# Patient Record
Sex: Male | Born: 1955 | Race: White | Hispanic: No | Marital: Married | Smoking: Never smoker
Health system: Southern US, Community
[De-identification: ages and names within clinical notes are randomized; demographics above are authoritative.]

## PROBLEM LIST (undated history)

## (undated) DIAGNOSIS — I10 Essential (primary) hypertension: Secondary | ICD-10-CM

## (undated) DIAGNOSIS — C801 Malignant (primary) neoplasm, unspecified: Secondary | ICD-10-CM

## (undated) DIAGNOSIS — I639 Cerebral infarction, unspecified: Secondary | ICD-10-CM

## (undated) DIAGNOSIS — E7800 Pure hypercholesterolemia, unspecified: Secondary | ICD-10-CM

## (undated) DIAGNOSIS — R569 Unspecified convulsions: Secondary | ICD-10-CM

## (undated) HISTORY — PX: APPENDECTOMY: SHX54

## (undated) HISTORY — DX: Pure hypercholesterolemia, unspecified: E78.00

## (undated) HISTORY — DX: Unspecified convulsions: R56.9

## (undated) HISTORY — DX: Cerebral infarction, unspecified: I63.9

## (undated) HISTORY — DX: Malignant (primary) neoplasm, unspecified: C80.1

## (undated) HISTORY — DX: Essential (primary) hypertension: I10

---

## 2011-07-16 ENCOUNTER — Ambulatory Visit: Payer: BC Managed Care – PPO | Attending: Internal Medicine | Admitting: *Deleted

## 2011-07-16 DIAGNOSIS — I69998 Other sequelae following unspecified cerebrovascular disease: Secondary | ICD-10-CM | POA: Insufficient documentation

## 2011-07-16 DIAGNOSIS — R279 Unspecified lack of coordination: Secondary | ICD-10-CM | POA: Insufficient documentation

## 2011-07-16 DIAGNOSIS — Z5189 Encounter for other specified aftercare: Secondary | ICD-10-CM | POA: Insufficient documentation

## 2011-07-16 DIAGNOSIS — I69920 Aphasia following unspecified cerebrovascular disease: Secondary | ICD-10-CM | POA: Insufficient documentation

## 2011-07-16 DIAGNOSIS — R482 Apraxia: Secondary | ICD-10-CM | POA: Insufficient documentation

## 2011-07-16 DIAGNOSIS — M6281 Muscle weakness (generalized): Secondary | ICD-10-CM | POA: Insufficient documentation

## 2011-07-16 DIAGNOSIS — M2420 Disorder of ligament, unspecified site: Secondary | ICD-10-CM | POA: Insufficient documentation

## 2011-07-16 DIAGNOSIS — M629 Disorder of muscle, unspecified: Secondary | ICD-10-CM | POA: Insufficient documentation

## 2011-07-16 DIAGNOSIS — M25519 Pain in unspecified shoulder: Secondary | ICD-10-CM | POA: Insufficient documentation

## 2011-07-16 DIAGNOSIS — R269 Unspecified abnormalities of gait and mobility: Secondary | ICD-10-CM | POA: Insufficient documentation

## 2011-07-18 ENCOUNTER — Ambulatory Visit: Payer: BC Managed Care – PPO | Admitting: *Deleted

## 2011-07-24 ENCOUNTER — Ambulatory Visit: Payer: BC Managed Care – PPO | Admitting: *Deleted

## 2011-07-24 ENCOUNTER — Ambulatory Visit: Payer: BC Managed Care – PPO | Admitting: Physical Therapy

## 2011-07-31 ENCOUNTER — Ambulatory Visit: Payer: BC Managed Care – PPO

## 2011-07-31 ENCOUNTER — Ambulatory Visit: Payer: BC Managed Care – PPO | Admitting: Physical Therapy

## 2011-08-03 ENCOUNTER — Encounter: Payer: Self-pay | Admitting: Occupational Therapy

## 2011-08-03 ENCOUNTER — Ambulatory Visit: Payer: Self-pay | Admitting: Physical Therapy

## 2011-08-07 ENCOUNTER — Ambulatory Visit: Payer: Self-pay | Admitting: Physical Therapy

## 2011-08-07 ENCOUNTER — Ambulatory Visit: Payer: BC Managed Care – PPO | Admitting: Occupational Therapy

## 2011-08-07 ENCOUNTER — Ambulatory Visit: Payer: BC Managed Care – PPO | Admitting: Rehabilitative and Restorative Service Providers"

## 2011-08-10 ENCOUNTER — Ambulatory Visit: Payer: BC Managed Care – PPO | Attending: Internal Medicine | Admitting: Occupational Therapy

## 2011-08-10 DIAGNOSIS — R482 Apraxia: Secondary | ICD-10-CM | POA: Insufficient documentation

## 2011-08-10 DIAGNOSIS — I69998 Other sequelae following unspecified cerebrovascular disease: Secondary | ICD-10-CM | POA: Insufficient documentation

## 2011-08-10 DIAGNOSIS — M25519 Pain in unspecified shoulder: Secondary | ICD-10-CM | POA: Insufficient documentation

## 2011-08-10 DIAGNOSIS — R269 Unspecified abnormalities of gait and mobility: Secondary | ICD-10-CM | POA: Insufficient documentation

## 2011-08-10 DIAGNOSIS — I69920 Aphasia following unspecified cerebrovascular disease: Secondary | ICD-10-CM | POA: Insufficient documentation

## 2011-08-10 DIAGNOSIS — R279 Unspecified lack of coordination: Secondary | ICD-10-CM | POA: Insufficient documentation

## 2011-08-10 DIAGNOSIS — M2420 Disorder of ligament, unspecified site: Secondary | ICD-10-CM | POA: Insufficient documentation

## 2011-08-10 DIAGNOSIS — M629 Disorder of muscle, unspecified: Secondary | ICD-10-CM | POA: Insufficient documentation

## 2011-08-10 DIAGNOSIS — M6281 Muscle weakness (generalized): Secondary | ICD-10-CM | POA: Insufficient documentation

## 2011-08-10 DIAGNOSIS — Z5189 Encounter for other specified aftercare: Secondary | ICD-10-CM | POA: Insufficient documentation

## 2011-08-13 ENCOUNTER — Ambulatory Visit: Payer: BC Managed Care – PPO | Admitting: Physical Therapy

## 2011-08-13 ENCOUNTER — Ambulatory Visit: Payer: BC Managed Care – PPO | Admitting: Occupational Therapy

## 2011-08-13 ENCOUNTER — Ambulatory Visit: Payer: BC Managed Care – PPO | Admitting: Speech Pathology

## 2011-08-14 ENCOUNTER — Ambulatory Visit: Payer: BC Managed Care – PPO

## 2011-08-14 ENCOUNTER — Ambulatory Visit: Payer: BC Managed Care – PPO | Admitting: Occupational Therapy

## 2011-08-14 ENCOUNTER — Ambulatory Visit: Payer: BC Managed Care – PPO | Admitting: Physical Therapy

## 2011-08-15 ENCOUNTER — Encounter: Payer: Self-pay | Admitting: Physical Medicine & Rehabilitation

## 2011-08-21 ENCOUNTER — Ambulatory Visit: Payer: BC Managed Care – PPO | Admitting: Occupational Therapy

## 2011-08-21 ENCOUNTER — Ambulatory Visit: Payer: BC Managed Care – PPO | Admitting: Physical Therapy

## 2011-08-21 ENCOUNTER — Ambulatory Visit: Payer: BC Managed Care – PPO

## 2011-08-23 ENCOUNTER — Ambulatory Visit: Payer: BC Managed Care – PPO

## 2011-08-23 ENCOUNTER — Ambulatory Visit: Payer: BC Managed Care – PPO | Admitting: Physical Therapy

## 2011-08-23 ENCOUNTER — Ambulatory Visit: Payer: BC Managed Care – PPO | Admitting: Occupational Therapy

## 2011-08-28 ENCOUNTER — Ambulatory Visit: Payer: BC Managed Care – PPO | Admitting: Physical Therapy

## 2011-08-28 ENCOUNTER — Encounter: Payer: BC Managed Care – PPO | Admitting: Occupational Therapy

## 2011-08-30 ENCOUNTER — Ambulatory Visit: Payer: BC Managed Care – PPO | Admitting: Physical Therapy

## 2011-08-30 ENCOUNTER — Ambulatory Visit: Payer: BC Managed Care – PPO

## 2011-08-30 ENCOUNTER — Ambulatory Visit: Payer: BC Managed Care – PPO | Admitting: Occupational Therapy

## 2011-09-04 ENCOUNTER — Ambulatory Visit: Payer: BC Managed Care – PPO | Admitting: Occupational Therapy

## 2011-09-04 ENCOUNTER — Ambulatory Visit: Payer: BC Managed Care – PPO | Admitting: Physical Therapy

## 2011-09-06 ENCOUNTER — Ambulatory Visit: Payer: BC Managed Care – PPO | Admitting: Physical Therapy

## 2011-09-06 ENCOUNTER — Ambulatory Visit: Payer: BC Managed Care – PPO | Admitting: Occupational Therapy

## 2011-09-07 ENCOUNTER — Ambulatory Visit: Payer: BC Managed Care – PPO

## 2011-09-11 ENCOUNTER — Ambulatory Visit: Payer: BC Managed Care – PPO | Admitting: Occupational Therapy

## 2011-09-11 ENCOUNTER — Ambulatory Visit: Payer: BC Managed Care – PPO | Attending: Internal Medicine | Admitting: Physical Therapy

## 2011-09-11 DIAGNOSIS — R279 Unspecified lack of coordination: Secondary | ICD-10-CM | POA: Insufficient documentation

## 2011-09-11 DIAGNOSIS — R269 Unspecified abnormalities of gait and mobility: Secondary | ICD-10-CM | POA: Insufficient documentation

## 2011-09-11 DIAGNOSIS — Z5189 Encounter for other specified aftercare: Secondary | ICD-10-CM | POA: Insufficient documentation

## 2011-09-11 DIAGNOSIS — I69920 Aphasia following unspecified cerebrovascular disease: Secondary | ICD-10-CM | POA: Insufficient documentation

## 2011-09-11 DIAGNOSIS — M6281 Muscle weakness (generalized): Secondary | ICD-10-CM | POA: Insufficient documentation

## 2011-09-11 DIAGNOSIS — M629 Disorder of muscle, unspecified: Secondary | ICD-10-CM | POA: Insufficient documentation

## 2011-09-11 DIAGNOSIS — M2420 Disorder of ligament, unspecified site: Secondary | ICD-10-CM | POA: Insufficient documentation

## 2011-09-11 DIAGNOSIS — M25519 Pain in unspecified shoulder: Secondary | ICD-10-CM | POA: Insufficient documentation

## 2011-09-11 DIAGNOSIS — I69998 Other sequelae following unspecified cerebrovascular disease: Secondary | ICD-10-CM | POA: Insufficient documentation

## 2011-09-11 DIAGNOSIS — R482 Apraxia: Secondary | ICD-10-CM | POA: Insufficient documentation

## 2011-09-13 ENCOUNTER — Ambulatory Visit: Payer: BC Managed Care – PPO

## 2011-09-14 ENCOUNTER — Encounter: Payer: Self-pay | Admitting: Physical Medicine & Rehabilitation

## 2011-09-14 ENCOUNTER — Ambulatory Visit: Payer: BC Managed Care – PPO | Admitting: Physical Therapy

## 2011-09-14 ENCOUNTER — Ambulatory Visit: Payer: BC Managed Care – PPO | Admitting: Occupational Therapy

## 2011-09-14 ENCOUNTER — Encounter
Payer: BC Managed Care – PPO | Attending: Physical Medicine & Rehabilitation | Admitting: Physical Medicine & Rehabilitation

## 2011-09-14 VITALS — BP 113/63 | HR 68 | Resp 14 | Ht 67.0 in | Wt 157.0 lb

## 2011-09-14 DIAGNOSIS — Z7902 Long term (current) use of antithrombotics/antiplatelets: Secondary | ICD-10-CM | POA: Insufficient documentation

## 2011-09-14 DIAGNOSIS — I69920 Aphasia following unspecified cerebrovascular disease: Secondary | ICD-10-CM | POA: Insufficient documentation

## 2011-09-14 DIAGNOSIS — I6330 Cerebral infarction due to thrombosis of unspecified cerebral artery: Secondary | ICD-10-CM

## 2011-09-14 DIAGNOSIS — E119 Type 2 diabetes mellitus without complications: Secondary | ICD-10-CM | POA: Insufficient documentation

## 2011-09-14 DIAGNOSIS — F341 Dysthymic disorder: Secondary | ICD-10-CM | POA: Insufficient documentation

## 2011-09-14 DIAGNOSIS — G8110 Spastic hemiplegia affecting unspecified side: Secondary | ICD-10-CM | POA: Insufficient documentation

## 2011-09-14 DIAGNOSIS — I69959 Hemiplegia and hemiparesis following unspecified cerebrovascular disease affecting unspecified side: Secondary | ICD-10-CM | POA: Insufficient documentation

## 2011-09-14 MED ORDER — BACLOFEN 10 MG PO TABS: 10.0000 mg | ORAL_TABLET | Freq: Four times a day (QID) | ORAL | Status: DC

## 2011-09-14 NOTE — Patient Instructions (Signed)
Call me with problems or questions 

## 2011-09-14 NOTE — Progress Notes (Signed)
Subjective:    Patient ID: Craig Parrish, male    DOB: [birth year], 56 y.o.   MRN: [id number]  HPI  This is a pleasant 56 yo WM referred here by Eric Form who suffered a left MCA infarct while living in [place] 03/06/10.  He was hospitalized and in rehab for approximately 3 months before he came home. He moved here in June of 2013.  He has been involved in PT, OT, and SLP with the neuro rehab team at [facility] since July. Thus far, they are pleased with his progress. They are looking at neuro-stim units with him therapy as well. They have been informed regarding [facility]'s speech program.   His spasticity has been a problem. He was placed on baclofen early on for cramping at 5mg  TID, but it was eventually stopped. He stretches regularly, wears his orthotics. He generally walks twice a week. He does like to walk in the mall and community as well.  Sleep is good. Bowel and bladder function is nearly normal. He is able to shower independntly. He uses a shower seat to facilitate transfers and for safety.   From a mood standpoint, he's doing quite well. He has been on celexa for over a year.   He takes plavix for CVA prophylaxis and sugars are under good control.   Pain Inventory Average Pain 0 Pain Right Now 0 My pain is intermittent  In the last 24 hours, has pain interfered with the following? General activity 0 Relation with others 0 Enjoyment of life 0 What TIME of day is your pain at its worst? n/ Sleep (in general) Good  Pain is worse with: some activites Pain improves with: pacing activities Relief from Meds: 0  Mobility walk without assistance how many minutes can you walk? 60 ability to climb steps?  yes do you drive?  no  Function retired I need assistance with the following:  meal prep and shopping  Neuro/Psych weakness numbness  Prior Studies x-rays CT/MRI  Physicians involved in your care Any changes since last visit?  no   Family History  Problem Relation Age of  Onset  . Heart disease Mother   . Diabetes Mother   . Heart disease Father    History   Social History  . Marital Status: Married    Spouse Name: N/A    Number of Children: N/A  . Years of Education: N/A   Social History Main Topics  . Smoking status: Never Smoker   . Smokeless tobacco: Never Used  . Alcohol Use: Yes  . Drug Use: None  . Sexually Active: None   Other Topics Concern  . None   Social History Narrative  . None   Past Surgical History  Procedure Date  . Appendectomy    Past Medical History  Diagnosis Date  . Diabetes mellitus   . Stroke   . Cancer    BP 113/63  Pulse 68  Resp 14  Ht 5\' 7"  (1.702 m)  Wt 157 lb (71.215 kg)  BMI 24.59 kg/m2  SpO2 98%     Review of Systems  Musculoskeletal: Positive for myalgias and arthralgias.  Neurological: Positive for weakness and numbness.  All other systems reviewed and are negative.       Objective:   Physical Exam  General: Alert and oriented x 3, No apparent distress HEENT: Head is normocephalic, atraumatic, PERRLA, EOMI, sclera anicteric, oral mucosa pink and moist, dentition intact, ext ear canals clear,  Neck: Supple without JVD or lymphadenopathy  Heart: Reg rate and rhythm. No murmurs rubs or gallops Chest: CTA bilaterally without wheezes, rales, or rhonchi; no distress Abdomen: Soft, non-tender, non-distended, bowel sounds positive. Extremities: No clubbing, cyanosis, or edema. Pulses are 2+ Skin: Clean and intact without signs of breakdown Neuro: He is alert and oriented. RUE is 1/5. HF are 2. HAD are 3. KE 3-, KF 2+, ADF trace, APF is 1-2. DTR's are 3+ on the right. Tone is 1+ at FDS, FDP. 2-3 PT, BR. Biceps 1-2. PECS 2-3. Lats 2, TM 2.. He has a mild right central 7 and tongue deviation. Sensation is 1 to 1+ on the right. 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 He was able to name a few simple objects although these words were sometimes slightly garbled. He was able to repeat  words with the same pattern.  He could tell me the date with multiple cues. He is apraxic with his movements also. He ambulated for me today and has difficulty with HF and KF as well as ADF. He tends to substitute using a circumducted gait patter. The knee did not hyperextend.  Musculoskeletal: Full ROM, No pain with AROM or PROM in the neck, trunk, or extremities. Posture appropriate Psych: Pt's affect is appropriate. Pt is cooperative         Assessment & Plan:  Assessment: 1. 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 Reactive depression- treated 3. NIRDM- controlled    Plan: 1. Continue with PT, OT, and SLP at [facility] neuro rehab. I will speak with the team regarding the treatment plan. I reviewed principles at length the patient and wife today. I think bioness would be an adequate surrogate for the Mellissa Kohut he was working on in [place]. 2. Begin a re-trial of baclofen. Start at 5mg  TID and titrate upwards over the next few weeks. He has so much spasticity that I would like to try "broad strokes" first before we move to botox. Discussed regular stretching as well. 3. Discussed emphasis on quality of movement rather than quantity. He has developed some substitution patterns over the last year plus. 4. Medical mgt per Dr. Clelia Croft. 5. I will see him back in about a month.  I would like to thank Dr. Clelia Croft for the referral of this nice gentleman.

## 2011-09-18 ENCOUNTER — Ambulatory Visit: Payer: BC Managed Care – PPO | Admitting: *Deleted

## 2011-09-18 ENCOUNTER — Ambulatory Visit: Payer: BC Managed Care – PPO

## 2011-09-21 ENCOUNTER — Ambulatory Visit: Payer: BC Managed Care – PPO | Admitting: *Deleted

## 2011-09-24 ENCOUNTER — Ambulatory Visit: Payer: BC Managed Care – PPO | Admitting: Occupational Therapy

## 2011-09-25 ENCOUNTER — Ambulatory Visit: Payer: BC Managed Care – PPO | Admitting: Occupational Therapy

## 2011-09-26 ENCOUNTER — Emergency Department (HOSPITAL_COMMUNITY): Payer: BC Managed Care – PPO

## 2011-09-26 ENCOUNTER — Encounter (HOSPITAL_COMMUNITY): Payer: Self-pay | Admitting: *Deleted

## 2011-09-26 ENCOUNTER — Emergency Department (HOSPITAL_COMMUNITY)
Admission: EM | Admit: 2011-09-26 | Discharge: 2011-09-26 | Disposition: A | Discharge status: Home / Self Care | Payer: BC Managed Care – PPO | Attending: Emergency Medicine | Admitting: Emergency Medicine

## 2011-09-26 DIAGNOSIS — R569 Unspecified convulsions: Secondary | ICD-10-CM

## 2011-09-26 DIAGNOSIS — E119 Type 2 diabetes mellitus without complications: Secondary | ICD-10-CM | POA: Insufficient documentation

## 2011-09-26 DIAGNOSIS — R4701 Aphasia: Secondary | ICD-10-CM | POA: Insufficient documentation

## 2011-09-26 DIAGNOSIS — I69959 Hemiplegia and hemiparesis following unspecified cerebrovascular disease affecting unspecified side: Secondary | ICD-10-CM | POA: Insufficient documentation

## 2011-09-26 DIAGNOSIS — Z79899 Other long term (current) drug therapy: Secondary | ICD-10-CM | POA: Insufficient documentation

## 2011-09-26 DIAGNOSIS — I69359 Hemiplegia and hemiparesis following cerebral infarction affecting unspecified side: Secondary | ICD-10-CM

## 2011-09-26 LAB — CBC WITH DIFFERENTIAL/PLATELET
Eosinophils Absolute: 0.2 10*3/uL (ref 0.0–0.7)
HCT: 42.7 % (ref 39.0–52.0)
Hemoglobin: 14.7 g/dL (ref 13.0–17.0)
Lymphs Abs: 1.5 10*3/uL (ref 0.7–4.0)
MCH: 30.4 pg (ref 26.0–34.0)
Monocytes Relative: 6 % (ref 3–12)
Neutro Abs: 4.5 10*3/uL (ref 1.7–7.7)
Neutrophils Relative %: 68 % (ref 43–77)
RBC: 4.83 MIL/uL (ref 4.22–5.81)

## 2011-09-26 LAB — URINALYSIS, ROUTINE W REFLEX MICROSCOPIC
Ketones, ur: NEGATIVE mg/dL
Leukocytes, UA: NEGATIVE
Nitrite: NEGATIVE
Protein, ur: NEGATIVE mg/dL

## 2011-09-26 LAB — COMPREHENSIVE METABOLIC PANEL
Alkaline Phosphatase: 66 U/L (ref 39–117)
BUN: 13 mg/dL (ref 6–23)
Chloride: 104 mEq/L (ref 96–112)
GFR calc Af Amer: >90 mL/min (ref >90)
Glucose, Bld: 149 mg/dL — ABNORMAL HIGH (ref 70–99)
Potassium: 4.3 mEq/L (ref 3.5–5.1)
Total Bilirubin: 0.5 mg/dL (ref 0.3–1.2)

## 2011-09-26 MED ORDER — SODIUM CHLORIDE 0.9 % IV SOLN
1000.0000 mg | Freq: Once | INTRAVENOUS | Status: AC
  Administered 2011-09-26: 1000 mg via INTRAVENOUS
  Filled 2011-09-26 (×2): qty 10

## 2011-09-26 MED ORDER — LEVETIRACETAM 500 MG PO TABS: 500.0000 mg | ORAL_TABLET | Freq: Two times a day (BID) | ORAL | Status: DC

## 2011-09-26 NOTE — ED Notes (Signed)
Pt tried to give a sample but was unsuccessful. [place] Spaniel he would try again.

## 2011-09-26 NOTE — ED Notes (Addendum)
EMS called for seizure activity from wife.  EMS arrived and patient was found to be post-ictal.  Patient is A & O x 2 on arrival.  History of CVA on right side with weakness to same.

## 2011-09-26 NOTE — ED Notes (Signed)
Pt reminded that he needed to give a urine sample. Dr Brock Bad giving the patient water so pt stated he would try again later after drinking the water.

## 2011-09-26 NOTE — ED Notes (Signed)
OK to given home medications per Dr. Judd Lien.  Family remains at bedside.

## 2011-09-26 NOTE — ED Provider Notes (Signed)
History     CSN: [id number]  Arrival date & time 09/26/11  1113   First MD Initiated Contact with Patient 09/26/11 1133      Chief Complaint  Patient presents with  . Seizures    (Consider location/radiation/quality/duration/timing/severity/associated sxs/prior treatment) HPI Comments: Patient with history of CVA with right sided hemiplegia.  Was at home with his wife today when he started making a grunting noise, then his arm raised, he leaned backward, and began shaking all over.  This lasted for approximately 2 minutes.  911 was called and seizure had resolved prior to their arrival.  He was initially post-ictal, but has returned to his baseline since arrival here.  He did bite his tongue.    He denies any recent illness, fever, or other complaints leading up to this.  No history of seizures.    Patient is a 56 y.o. male presenting with seizures. The history is provided by the patient.  Seizures  This is a new problem. The current episode started less than 1 hour ago. The problem has been resolved. There was 1 seizure. The most recent episode lasted 2 to 5 minutes. Characteristics include rhythmic jerking. The episode was witnessed. There was no sensation of an aura present. The seizures did not continue in the ED. The seizure(s) had no focality. Possible causes do not include med or dosage change. There has been no fever.    Past Medical History  Diagnosis Date  . Diabetes mellitus   . Stroke   . Cancer     Past Surgical History  Procedure Date  . Appendectomy     Family History  Problem Relation Age of Onset  . Heart disease Mother   . Diabetes Mother   . Heart disease Father     History  Substance Use Topics  . Smoking status: Never Smoker   . Smokeless tobacco: Never Used  . Alcohol Use: Yes      Review of Systems  Neurological: Positive for seizures.  All other systems reviewed and are negative.    Allergies  Aspirin; Other; and Penicillins  Home  Medications   Current Outpatient Rx  Name Route Sig Dispense Refill  . ATORVASTATIN CALCIUM 20 MG PO TABS Oral Take 20 mg by mouth at bedtime.     Marland Kitchen BACLOFEN 10 MG PO TABS Oral Take 10 mg by mouth 3 (three) times daily. For 1 week then 1 tablet 4 times a day.  Started three times a day 09/25/11    . CITALOPRAM HYDROBROMIDE 20 MG PO TABS Oral Take 20 mg by mouth daily.     Marland Kitchen CLOPIDOGREL BISULFATE 75 MG PO TABS Oral Take 75 mg by mouth daily.     . OMEGA-3 FATTY ACIDS 1000 MG PO CAPS Oral Take 1 g by mouth 2 (two) times daily.    Marland Kitchen METFORMIN HCL 500 MG PO TABS Oral Take 500 mg by mouth every evening.     Marland Kitchen ACCU-CHEK AVIVA PLUS VI STRP        BP 132/78  Pulse 100  Temp 98.9 F (37.2 C) (Oral)  Resp 14  SpO2 100%  Physical Exam  Nursing note and vitals reviewed. Constitutional: He is oriented to person, place, and time. He appears well-developed. No distress.  HENT:  Head: Normocephalic and atraumatic.  Mouth/Throat: Oropharynx is clear and moist.  Eyes: EOM are normal. Pupils are equal, round, and reactive to light.  Neck: Normal range of motion. Neck supple.  Cardiovascular: Normal rate  and regular rhythm.   No murmur heard. Pulmonary/Chest: Effort normal and breath sounds normal. No respiratory distress. He has no wheezes.  Abdominal: Soft. Bowel sounds are normal. He exhibits no distension. There is no tenderness.  Musculoskeletal: Normal range of motion.  Neurological: He is alert and oriented to person, place, and time.       There is a right-sided hemiplegia noted.  He also displays an expressive aphasia.  The left arm and leg strength is 5/5.    Skin: Skin is warm. He is not diaphoretic.    ED Course  Procedures (including critical care time)   Labs Reviewed  CBC WITH DIFFERENTIAL  COMPREHENSIVE METABOLIC PANEL  URINALYSIS, ROUTINE W REFLEX MICROSCOPIC   No results found.   No diagnosis found.   Date: 09/26/2011  Rate: 100  Rhythm: sinus tachycardia  QRS  Axis: normal  Intervals: normal  ST/T Wave abnormalities: normal  Conduction Disutrbances:none  Narrative Interpretation:   Old EKG Reviewed: none available    MDM  The patient presents here by ems after a seizure that occurred at home.  He has a history of stroke with a right sided hemiplegia, but has never had a seizure before.  By the time he arrived here, he was back at his baseline.  The workup shows an old cva but nothing acute on ct and normal labs.  I have discussed this case with Dr. Roseanne Reno from neurology who feels as though he will require anti-seizure medications, specifically keppra.  This will be given iv today.  Once infused, he will be discharged to home with an oral dose of 500 mg twice daily.  I have also spoken with Dr. Clelia Croft from [facility] to update him on what had transpired and new medication we were planning to start him on.  He has agreed to arrange follow up for him in the office in the near future, and also arrange a follow up appointment with neurology.          Geoffery Lyons, MD 09/26/11 [phone]

## 2011-09-27 ENCOUNTER — Encounter: Payer: BC Managed Care – PPO | Admitting: *Deleted

## 2011-10-10 ENCOUNTER — Encounter
Payer: BC Managed Care – PPO | Attending: Physical Medicine & Rehabilitation | Admitting: Physical Medicine & Rehabilitation

## 2011-10-10 ENCOUNTER — Encounter: Payer: Self-pay | Admitting: Physical Medicine & Rehabilitation

## 2011-10-10 VITALS — BP 102/67 | HR 57 | Resp 14 | Ht 67.0 in | Wt 187.0 lb

## 2011-10-10 DIAGNOSIS — F341 Dysthymic disorder: Secondary | ICD-10-CM | POA: Insufficient documentation

## 2011-10-10 DIAGNOSIS — I6330 Cerebral infarction due to thrombosis of unspecified cerebral artery: Secondary | ICD-10-CM

## 2011-10-10 DIAGNOSIS — G8110 Spastic hemiplegia affecting unspecified side: Secondary | ICD-10-CM

## 2011-10-10 DIAGNOSIS — E119 Type 2 diabetes mellitus without complications: Secondary | ICD-10-CM | POA: Insufficient documentation

## 2011-10-10 DIAGNOSIS — I69959 Hemiplegia and hemiparesis following unspecified cerebrovascular disease affecting unspecified side: Secondary | ICD-10-CM | POA: Insufficient documentation

## 2011-10-10 DIAGNOSIS — I69920 Aphasia following unspecified cerebrovascular disease: Secondary | ICD-10-CM | POA: Insufficient documentation

## 2011-10-10 DIAGNOSIS — Z7902 Long term (current) use of antithrombotics/antiplatelets: Secondary | ICD-10-CM | POA: Insufficient documentation

## 2011-10-10 NOTE — Progress Notes (Signed)
Subjective:    Patient ID: Craig Parrish, male    DOB: April 11, 1955, 56 y.o.   MRN: [id number]  HPI  Craig Parrish is back regarding his spastic right HP. Unfortunately, he had a seizure about 3 weeks ago. He was seen in the ED and placed on Keppra. His celexa was decreased and the plan is to taper it off over the next few weeks per Dr. Clelia Croft. He has an appt to see Neurology later this month.   The baclofen did help his spasticity. His wife felt that the QID dosing made him a little too limp and she backed off to the TID which she feels works well for him.  Pain Inventory Average Pain 0 Pain Right Now 0 My pain is intermittent  In the last 24 hours, has pain interfered with the following? General activity 0 Relation with others 0 Enjoyment of life 0 What TIME of day is your pain at its worst? n/a Sleep (in general) Good  Pain is worse with: some activites Pain improves with: pacing activities Relief from Meds: 0  Mobility walk with assistance how many minutes can you walk? 60 ability to climb steps?  yes do you drive?  no Do you have any goals in this area?  yes  Function retired I need assistance with the following:  meal prep and shopping  Neuro/Psych numbness  Prior Studies Any changes since last visit?  no  Physicians involved in your care Any changes since last visit?  no   Family History  Problem Relation Age of Onset  . Heart disease Mother   . Diabetes Mother   . Heart disease Father    History   Social History  . Marital Status: Married    Spouse Name: N/A    Number of Children: N/A  . Years of Education: N/A   Social History Main Topics  . Smoking status: Never Smoker   . Smokeless tobacco: Never Used  . Alcohol Use: Yes  . Drug Use: None  . Sexually Active: None   Other Topics Concern  . None   Social History Narrative  . None   Past Surgical History  Procedure Date  . Appendectomy    Past Medical History  Diagnosis Date  . Diabetes  mellitus   . Stroke   . Cancer    BP 102/67  Pulse 57  Resp 14  Ht 5\' 7"  (1.702 m)  Wt 187 lb (84.823 kg)  BMI 29.29 kg/m2  SpO2 98%     Review of Systems  Musculoskeletal: Positive for myalgias and arthralgias.  Neurological: Positive for numbness.  All other systems reviewed and are negative.       Objective:   Physical Exam General: Alert and oriented x 3, No apparent distress  HEENT: Head is normocephalic, atraumatic, PERRLA, EOMI, sclera anicteric, oral mucosa pink and moist, dentition intact, ext ear canals clear,  Neck: Supple without JVD or lymphadenopathy  Heart: Reg rate and rhythm. No murmurs rubs or gallops  Chest: CTA bilaterally without wheezes, rales, or rhonchi; no distress  Abdomen: Soft, non-tender, non-distended, bowel sounds positive.  Extremities: No clubbing, cyanosis, or edema. Pulses are 2+  Skin: Clean and intact without signs of breakdown  Neuro: He is alert and oriented. RUE is 1/5. HF are 2. HAD are 3. KE 3-, KF 2+, ADF trace, APF is 1-2. DTR's are 3+ on the right. Tone is 1+ at FDS, FDP. 1-2 PT, BR. Biceps 1-2. PECS 1. Lats 1, TM 2.Marland Kitchen He  has a mild right central 7 and tongue deviation. Sensation is 1 to 1+ on the right. 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 He was able to name a few simple objects although these words were sometimes slightly garbled. He was able to repeat words with the same pattern. He could tell me the date with multiple cues. He is apraxic with his movements also. He walks with some weight shift to the right and hyperextension of the right knee in stance.  Musculoskeletal: Full ROM, No pain with AROM or PROM in the neck, trunk, or extremities. Posture appropriate  Psych: Pt's affect is appropriate. Pt is cooperative   Assessment & Plan:   Assessment:  1. 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 Reactive depression- treat  3. NIRDM- controlled   Plan:    1. Will set him up for botox 300 units to the right biceps, brachioradialis, FDP, FDS.  2. Continue with baclofen at 10mg  TID, may increase to QID per pt/wife's discretion. Splinting should continue as well. 3. Discussed emphasis on quality of movement rather than quantity. He has developed some substitution patterns over the last year plus.  4. Keppra 500mg  Q12 per Dr. Clelia Croft. Agree with celexa wean. 5. I will see him back pending approval of injections.

## 2011-10-10 NOTE — Patient Instructions (Signed)
Call me with questions 

## 2011-12-04 ENCOUNTER — Encounter
Payer: BC Managed Care – PPO | Attending: Physical Medicine & Rehabilitation | Admitting: Physical Medicine & Rehabilitation

## 2011-12-04 ENCOUNTER — Telehealth: Payer: Self-pay | Admitting: *Deleted

## 2011-12-04 ENCOUNTER — Encounter: Payer: Self-pay | Admitting: Physical Medicine & Rehabilitation

## 2011-12-04 VITALS — BP 118/69 | HR 93 | Resp 14 | Ht 67.0 in | Wt 190.0 lb

## 2011-12-04 DIAGNOSIS — I6330 Cerebral infarction due to thrombosis of unspecified cerebral artery: Secondary | ICD-10-CM | POA: Insufficient documentation

## 2011-12-04 DIAGNOSIS — G8110 Spastic hemiplegia affecting unspecified side: Secondary | ICD-10-CM | POA: Insufficient documentation

## 2011-12-04 NOTE — Patient Instructions (Signed)
CONTINUE RANGE OF MOTION AND STRETCHING EXERCISES AT HOME

## 2011-12-04 NOTE — Telephone Encounter (Signed)
Called and spoke with Alanta at [facility]. She is calling to schedule deliver of patients Botox. Please call her at [phone].

## 2011-12-04 NOTE — Telephone Encounter (Signed)
Please call [facility] for prescription refills. Did not specify which Prescriptions

## 2011-12-04 NOTE — Progress Notes (Signed)
Botox Injection for spasticity using needle EMG guidance  Dilution: 100 Units/ml Indication: Severe spasticity which interferes with ADL,mobility and/or  hygiene and is unresponsive to medication management and other conservative care Informed consent was obtained after describing risks and benefits of the procedure with the patient. This includes bleeding, bruising, infection, excessive weakness, or medication side effects. A REMS form is on file and signed. Needle: 50mm injectable monopolar needle electrode Number of units per muscle   FCR 25 FCU25 FDS 75 FDP 75 PT: 50 Brachioradialis 50    All injections were done after obtaining appropriate EMG activity and after negative drawback for blood. The patient tolerated the procedure well. Post procedure instructions were given. A followup appointment was made for one month.

## 2011-12-12 ENCOUNTER — Telehealth: Payer: Self-pay

## 2011-12-12 DIAGNOSIS — I639 Cerebral infarction, unspecified: Secondary | ICD-10-CM

## 2011-12-12 DIAGNOSIS — G8110 Spastic hemiplegia affecting unspecified side: Secondary | ICD-10-CM

## 2011-12-12 NOTE — Telephone Encounter (Signed)
Patients wife called to get new order for therapy.  It needs to say occupational therapy right arm.  [facility]ax to [facility] at [phone]

## 2011-12-12 NOTE — Telephone Encounter (Signed)
Order placed and will be faxed.

## 2011-12-13 ENCOUNTER — Telehealth: Payer: Self-pay

## 2011-12-13 NOTE — Telephone Encounter (Signed)
Spoke with Maxine at rehab regarding order.  They said the order did not have Dr Riley Kill signature.  After reviewing the order on the phone it was acceptable.

## 2011-12-13 NOTE — Telephone Encounter (Signed)
Craig Parrish patients wife called to let us know that rehab would not accept occupational therapy orders.  Please call rehab [phone] to clarify.

## 2012-01-03 ENCOUNTER — Ambulatory Visit: Payer: BC Managed Care – PPO | Attending: Physical Medicine & Rehabilitation | Admitting: Occupational Therapy

## 2012-01-03 DIAGNOSIS — R269 Unspecified abnormalities of gait and mobility: Secondary | ICD-10-CM | POA: Insufficient documentation

## 2012-01-03 DIAGNOSIS — Z5189 Encounter for other specified aftercare: Secondary | ICD-10-CM | POA: Insufficient documentation

## 2012-01-03 DIAGNOSIS — M6281 Muscle weakness (generalized): Secondary | ICD-10-CM | POA: Insufficient documentation

## 2012-01-03 DIAGNOSIS — R279 Unspecified lack of coordination: Secondary | ICD-10-CM | POA: Insufficient documentation

## 2012-01-03 DIAGNOSIS — I69920 Aphasia following unspecified cerebrovascular disease: Secondary | ICD-10-CM | POA: Insufficient documentation

## 2012-01-03 DIAGNOSIS — M25519 Pain in unspecified shoulder: Secondary | ICD-10-CM | POA: Insufficient documentation

## 2012-01-03 DIAGNOSIS — M2420 Disorder of ligament, unspecified site: Secondary | ICD-10-CM | POA: Insufficient documentation

## 2012-01-03 DIAGNOSIS — R482 Apraxia: Secondary | ICD-10-CM | POA: Insufficient documentation

## 2012-01-03 DIAGNOSIS — M629 Disorder of muscle, unspecified: Secondary | ICD-10-CM | POA: Insufficient documentation

## 2012-01-03 DIAGNOSIS — I69998 Other sequelae following unspecified cerebrovascular disease: Secondary | ICD-10-CM | POA: Insufficient documentation

## 2012-01-14 ENCOUNTER — Encounter
Payer: BC Managed Care – PPO | Attending: Physical Medicine & Rehabilitation | Admitting: Physical Medicine & Rehabilitation

## 2012-01-14 DIAGNOSIS — I6330 Cerebral infarction due to thrombosis of unspecified cerebral artery: Secondary | ICD-10-CM | POA: Insufficient documentation

## 2012-01-14 DIAGNOSIS — G8110 Spastic hemiplegia affecting unspecified side: Secondary | ICD-10-CM | POA: Insufficient documentation

## 2012-01-16 ENCOUNTER — Ambulatory Visit: Payer: BC Managed Care – PPO | Attending: Physical Medicine & Rehabilitation | Admitting: Occupational Therapy

## 2012-01-16 DIAGNOSIS — R482 Apraxia: Secondary | ICD-10-CM | POA: Insufficient documentation

## 2012-01-16 DIAGNOSIS — M25519 Pain in unspecified shoulder: Secondary | ICD-10-CM | POA: Insufficient documentation

## 2012-01-16 DIAGNOSIS — I69998 Other sequelae following unspecified cerebrovascular disease: Secondary | ICD-10-CM | POA: Insufficient documentation

## 2012-01-16 DIAGNOSIS — M2420 Disorder of ligament, unspecified site: Secondary | ICD-10-CM | POA: Insufficient documentation

## 2012-01-16 DIAGNOSIS — I69920 Aphasia following unspecified cerebrovascular disease: Secondary | ICD-10-CM | POA: Insufficient documentation

## 2012-01-16 DIAGNOSIS — R269 Unspecified abnormalities of gait and mobility: Secondary | ICD-10-CM | POA: Insufficient documentation

## 2012-01-16 DIAGNOSIS — M629 Disorder of muscle, unspecified: Secondary | ICD-10-CM | POA: Insufficient documentation

## 2012-01-16 DIAGNOSIS — R279 Unspecified lack of coordination: Secondary | ICD-10-CM | POA: Insufficient documentation

## 2012-01-16 DIAGNOSIS — M6281 Muscle weakness (generalized): Secondary | ICD-10-CM | POA: Insufficient documentation

## 2012-01-16 DIAGNOSIS — Z5189 Encounter for other specified aftercare: Secondary | ICD-10-CM | POA: Insufficient documentation

## 2012-01-18 ENCOUNTER — Ambulatory Visit: Payer: BC Managed Care – PPO | Admitting: Occupational Therapy

## 2012-01-21 ENCOUNTER — Ambulatory Visit: Payer: BC Managed Care – PPO | Admitting: Occupational Therapy

## 2012-01-23 ENCOUNTER — Ambulatory Visit: Payer: BC Managed Care – PPO | Admitting: Occupational Therapy

## 2012-01-28 ENCOUNTER — Ambulatory Visit: Payer: BC Managed Care – PPO | Admitting: Occupational Therapy

## 2012-01-30 ENCOUNTER — Ambulatory Visit: Payer: BC Managed Care – PPO | Admitting: Occupational Therapy

## 2012-02-04 ENCOUNTER — Ambulatory Visit: Payer: BC Managed Care – PPO | Admitting: Occupational Therapy

## 2012-02-06 ENCOUNTER — Ambulatory Visit: Payer: BC Managed Care – PPO | Admitting: Occupational Therapy

## 2012-02-13 ENCOUNTER — Encounter
Payer: BC Managed Care – PPO | Attending: Physical Medicine & Rehabilitation | Admitting: Physical Medicine & Rehabilitation

## 2012-02-13 ENCOUNTER — Encounter: Payer: Self-pay | Admitting: Physical Medicine & Rehabilitation

## 2012-02-13 VITALS — BP 116/62 | HR 80 | Resp 14 | Ht 67.0 in | Wt 194.0 lb

## 2012-02-13 DIAGNOSIS — G8110 Spastic hemiplegia affecting unspecified side: Secondary | ICD-10-CM | POA: Insufficient documentation

## 2012-02-13 DIAGNOSIS — I6330 Cerebral infarction due to thrombosis of unspecified cerebral artery: Secondary | ICD-10-CM | POA: Insufficient documentation

## 2012-02-13 DIAGNOSIS — F329 Major depressive disorder, single episode, unspecified: Secondary | ICD-10-CM | POA: Insufficient documentation

## 2012-02-13 MED ORDER — SERTRALINE HCL 25 MG PO TABS: 25.0000 mg | ORAL_TABLET | Freq: Every day | ORAL | Status: DC

## 2012-02-13 NOTE — Patient Instructions (Signed)
Call me with questions    WORK ON YOUR STRETCHING!!!!!

## 2012-02-13 NOTE — Progress Notes (Signed)
Subjective:    Patient ID: Craig Parrish, male    DOB: 08/07/1955, 57 y.o.   MRN: [id number]  HPI  Craig Parrish is back regarding his CVA with spastic right hemiparesis. He had results with the botox, but was partially expecting them to bring back absent muscle activity in the right arm (which obviously didn't happen)---because of this, he was somewhat disappointed with the results. He has just finished up OT, but he hasn't been active with his stretching program at home. Pt admits to being bored with therapy, and he feels that he' becoming depressed again.   His wife notes that he tends to want to stay at home a lot and is afraid to go outside his comfort zone very often.   No further seizures are noted. He maintains on baclofen 10mg  tid  Pain Inventory Average Pain 0 Pain Right Now 0 My pain is n/a  In the last 24 hours, has pain interfered with the following? General activity 0 Relation with others 0 Enjoyment of life 0 What TIME of day is your pain at its worst? n/a Sleep (in general) Good  Pain is worse with: n/a Pain improves with: n/a Relief from Meds: n/a  Mobility walk without assistance how many minutes can you walk? 60 ability to climb steps?  yes do you drive?  no Do you have any goals in this area?  no  Function retired Do you have any goals in this area?  no  Neuro/Psych No problems in this area  Prior Studies Any changes since last visit?  no  Physicians involved in your care Any changes since last visit?  no   Family History  Problem Relation Age of Onset  . Heart disease Mother   . Diabetes Mother   . Heart disease Father    History   Social History  . Marital Status: Married    Spouse Name: N/A    Number of Children: N/A  . Years of Education: N/A   Social History Main Topics  . Smoking status: Never Smoker   . Smokeless tobacco: Never Used  . Alcohol Use: Yes  . Drug Use: None  . Sexually Active: None   Other Topics Concern  . None    Social History Narrative  . None   Past Surgical History  Procedure Date  . Appendectomy    Past Medical History  Diagnosis Date  . Diabetes mellitus   . Stroke   . Cancer    BP 116/62  Pulse 80  Resp 14  Ht 5\' 7"  (1.702 m)  Wt 194 lb (87.998 kg)  BMI 30.38 kg/m2  SpO2 100%     Review of Systems  All other systems reviewed and are negative.       Objective:   Physical Exam General: Alert and oriented x 3, No apparent distress  HEENT: Head is normocephalic, atraumatic, PERRLA, EOMI, sclera anicteric, oral mucosa pink and moist, dentition intact, ext ear canals clear,  Neck: Supple without JVD or lymphadenopathy  Heart: Reg rate and rhythm. No murmurs rubs or gallops  Chest: CTA bilaterally without wheezes, rales, or rhonchi; no distress  Abdomen: Soft, non-tender, non-distended, bowel sounds positive.  Extremities: No clubbing, cyanosis, or edema. Pulses are 2+  Skin: Clean and intact without signs of breakdown  Neuro: He is alert and oriented. RUE is 1/5. HF are 2. HAD are 3. KE 3-, KF 2+, ADF trace, APF is 1-2. DTR's are 3+ on the right. Tone is 1+ at [facility],  FDP. 1+ PT, BR. Biceps 1-2. PECS 1. Lats 1, TM 2.. He has a mild right central 7 and tongue deviation. Sensation is 1 to 1+ on the right. 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 He was able to name a few simple objects although these words were sometimes slightly garbled. He was able to repeat words with the same pattern. He could tell me the date with multiple cues. He is apraxic with his movements also. He walks with some weight shift to the right and hyperextension of the right knee in stance.  Musculoskeletal: Full ROM, No pain with AROM or PROM in the neck, trunk, or extremities. Posture appropriate  Psych: Pt's affect is appropriate. Pt is cooperative   Assessment & Plan:   Assessment:  1. 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  Reactive depression- resurfacing over the last couple months  3. NIRDM- controlled    Plan:  1. Will set him up again for botox 300 units to the right biceps, brachioradialis, FDP, FDS. PT. I expressed to him that I will not continue to perform these unless he is folllowing them up with the necessary stretching at home. Nevertheless, he is still improved from his baseline prior to the last botox injections.  2. Continue with baclofen at 10mg  TID for now 3. Will add zoloft 25mg  qday for mood as he has leveled off a bit functionally and emotionally over the last few months which is common at this point in recovery after stroke. The patient himself admits to being depressed and has become bored with rehab From a seizure standpoint,  I doubt his previous use of an SSRI was behind his September seizure, but we will try the zoloft instead of celexa regardless.  4. Keppra 500mg  Q12 per Dr. Clelia Croft.   5. Discussed inititiating a home exercise program perhaps at the [facility]. They commonly work with patients with disabilities, and I would be happy to speak with the directors at the [facility] on his behalf to help get him started there. 5. I will see him back for injections in a few weeks.30 minutes of face to face patient care time were spent during this visit. All questions were encouraged and answered.

## 2012-02-29 ENCOUNTER — Encounter: Payer: Self-pay | Admitting: Neurology

## 2012-02-29 DIAGNOSIS — E889 Metabolic disorder, unspecified: Secondary | ICD-10-CM | POA: Insufficient documentation

## 2012-04-22 ENCOUNTER — Ambulatory Visit: Payer: Self-pay | Admitting: Neurology

## 2012-05-05 ENCOUNTER — Encounter
Payer: BC Managed Care – PPO | Attending: Physical Medicine & Rehabilitation | Admitting: Physical Medicine & Rehabilitation

## 2012-05-05 ENCOUNTER — Encounter: Payer: Self-pay | Admitting: Physical Medicine & Rehabilitation

## 2012-05-05 VITALS — BP 132/81 | HR 63 | Resp 16 | Ht 70.0 in | Wt 197.0 lb

## 2012-05-05 DIAGNOSIS — I6330 Cerebral infarction due to thrombosis of unspecified cerebral artery: Secondary | ICD-10-CM | POA: Insufficient documentation

## 2012-05-05 DIAGNOSIS — G8110 Spastic hemiplegia affecting unspecified side: Secondary | ICD-10-CM | POA: Insufficient documentation

## 2012-05-05 NOTE — Progress Notes (Signed)
Botox Injection for spasticity using needle EMG guidance  Dilution: 100 Units/ml Indication: Severe spasticity RUE which interferes with ADL,mobility and/or  hygiene and is unresponsive to medication management and other conservative care Informed consent was obtained after describing risks and benefits of the procedure with the patient. This includes bleeding, bruising, infection, excessive weakness, or medication side effects. A REMS form is on file and signed. Needle: 50mm monopolar injectable needle electrode Number of units per muscle Pectoralis0 Biceps0 FCR 25 FCU 25u FDS 50u FDP 75 u FPL 25u PT 100 u Gastrosoleus 0 Hamstrings 0 All injections were done after obtaining appropriate EMG activity and after negative drawback for blood. The patient tolerated the procedure well. Post procedure instructions were given. A followup appointment was made.

## 2012-05-05 NOTE — Patient Instructions (Signed)
CALL ME WITH ANY PROBLEMS OR QUESTIONS (#[phone]).  HAVE A GOOD DAY  

## 2012-06-03 ENCOUNTER — Emergency Department (HOSPITAL_COMMUNITY)
Admission: EM | Admit: 2012-06-03 | Discharge: 2012-06-03 | Disposition: A | Discharge status: Home / Self Care | Payer: BC Managed Care – PPO | Attending: Emergency Medicine | Admitting: Emergency Medicine

## 2012-06-03 ENCOUNTER — Encounter (HOSPITAL_COMMUNITY): Payer: Self-pay | Admitting: Radiology

## 2012-06-03 DIAGNOSIS — Z79899 Other long term (current) drug therapy: Secondary | ICD-10-CM | POA: Insufficient documentation

## 2012-06-03 DIAGNOSIS — E119 Type 2 diabetes mellitus without complications: Secondary | ICD-10-CM | POA: Insufficient documentation

## 2012-06-03 DIAGNOSIS — R4701 Aphasia: Secondary | ICD-10-CM

## 2012-06-03 DIAGNOSIS — IMO0001 Reserved for inherently not codable concepts without codable children: Secondary | ICD-10-CM | POA: Insufficient documentation

## 2012-06-03 DIAGNOSIS — Z859 Personal history of malignant neoplasm, unspecified: Secondary | ICD-10-CM | POA: Insufficient documentation

## 2012-06-03 DIAGNOSIS — I69920 Aphasia following unspecified cerebrovascular disease: Secondary | ICD-10-CM | POA: Insufficient documentation

## 2012-06-03 DIAGNOSIS — I69959 Hemiplegia and hemiparesis following unspecified cerebrovascular disease affecting unspecified side: Secondary | ICD-10-CM | POA: Insufficient documentation

## 2012-06-03 DIAGNOSIS — R252 Cramp and spasm: Secondary | ICD-10-CM | POA: Insufficient documentation

## 2012-06-03 DIAGNOSIS — R569 Unspecified convulsions: Secondary | ICD-10-CM | POA: Insufficient documentation

## 2012-06-03 DIAGNOSIS — E7800 Pure hypercholesterolemia, unspecified: Secondary | ICD-10-CM | POA: Insufficient documentation

## 2012-06-03 DIAGNOSIS — I10 Essential (primary) hypertension: Secondary | ICD-10-CM | POA: Insufficient documentation

## 2012-06-03 MED ORDER — METHOCARBAMOL 500 MG PO TABS
500.0000 mg | ORAL_TABLET | Freq: Once | ORAL | Status: AC
  Administered 2012-06-03: 500 mg via ORAL
  Filled 2012-06-03: qty 1

## 2012-06-03 NOTE — ED Notes (Signed)
Pt presents with muscle cramp to lower right leg while walking. Bystander called EMS. Pt has expressive asphasia. Per pt he is baseline. Pt has contraction to right arm

## 2012-06-03 NOTE — ED Provider Notes (Signed)
History     CSN: [id number]  Arrival date & time 06/03/12  1336   First MD Initiated Contact with Patient 06/03/12 1350      Chief Complaint  Patient presents with  . Extremity Pain    (Consider location/radiation/quality/duration/timing/severity/associated sxs/prior treatment) HPI Craig Parrish is a 57 y.o. male who presents to ED with complaint of a cramp in right leg while walking today. Pt states he has hx of the same in the past, states takes "medication" for it. States hx of a stroke with right sided deficit since, states chronic weakness in right hand and leg. Wears right lower leg brace. States by standers called EMS because pt was unable to speak, and he was brought here. 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 Pt denies any injuries to his leg. No other complaints.    Past Medical History  Diagnosis Date  . Diabetes mellitus   . Stroke   . Cancer   . Hypertension   . High cholesterol   . Seizures     Past Surgical History  Procedure Laterality Date  . Appendectomy      Family History  Problem Relation Age of Onset  . Heart disease Mother   . Diabetes Mother   . Heart disease Father     History  Substance Use Topics  . Smoking status: Never Smoker   . Smokeless tobacco: Never Used  . Alcohol Use: Yes     Comment: 2 x week      Review of Systems  Constitutional: Negative for fever and chills.  HENT: Negative for neck pain and neck stiffness.   Respiratory: Negative.   Cardiovascular: Negative.   Musculoskeletal: Positive for myalgias.  Skin: Negative.   Neurological: Positive for speech difficulty. Negative for weakness and numbness.    Allergies  Aspirin; Other; and Penicillins  Home Medications   Current Outpatient Rx  Name  Route  Sig  Dispense  Refill  . ACCU-CHEK AVIVA PLUS test strip               . atorvastatin (LIPITOR) 20 MG tablet   Oral   Take 20 mg by mouth at bedtime.           . baclofen (LIORESAL) 10 MG tablet   Oral   Take 10 mg by mouth 3 (three) times daily. For 1 week then 1 tablet 4 times a day.  Started three times a day 09/25/11         . BOTOX 100 UNITS SOLR               . citalopram (CELEXA) 20 MG tablet   Oral   Take 20 mg by mouth daily.         . clopidogrel (PLAVIX) 75 MG tablet   Oral   Take 75 mg by mouth daily.          . fish oil-omega-3 fatty acids 1000 MG capsule   Oral   Take 1 g by mouth 2 (two) times daily.         Marland Kitchen levETIRAcetam (KEPPRA) 500 MG tablet   Oral   Take 500 mg by mouth daily.         . metFORMIN (GLUCOPHAGE) 500 MG tablet   Oral   Take 500 mg by mouth every evening.          . sertraline (ZOLOFT) 25 MG tablet   Oral   Take 1  tablet (25 mg total) by mouth daily.   30 tablet   3     BP 111/68  Pulse 74  Temp(Src) 98.4 F (36.9 C) (Oral)  Resp 18  SpO2 96%  Physical Exam  Nursing note and vitals reviewed. Constitutional: He is oriented to person, place, and time. He appears well-developed and well-nourished. No distress.  Eyes: Conjunctivae are normal.  Cardiovascular: Normal rate, regular rhythm and normal heart sounds.   Pulmonary/Chest: Effort normal and breath sounds normal. No respiratory distress. He has no wheezes. He has no rales.  Musculoskeletal: He exhibits no edema.  Right hand contracture, weakness in right hand. Right lower extremity weakness 4/5. Dorsal pedal pulses normal. Tender to palpation in right calf, no active spasm noted. Full rom of ankle and knee joints.   Neurological: He is alert and oriented to person, place, and time.  Skin: Skin is warm and dry.  Psychiatric: He has a normal mood and affect. His behavior is normal.    ED Course  Procedures (including critical care time)    1. Leg cramp   2. Aphasia       MDM  PT with leg cramp while walking today. Was brought here because was found aphasic, and EMS was not sure if this is acute finding. I  reviewed pt's records which indicate this is a chronic exam finding for the pat due to his prior strokes. Pt also confirms that his speech is unchanged. I have treated his spasm in ED with robaxin. He nhas no signs of leg ischemia or injury. No sings of cellulitis. Will d/c home with follow up with pcp.    Filed Vitals:   06/03/12 1358  BP: 111/68  Pulse: 74  Temp: 98.4 F (36.9 C)  TempSrc: Oral  Resp: 18  SpO2: 96%       Jahn Franchini A Aliahna Statzer, PA-C 06/03/12 1544

## 2012-06-03 NOTE — ED Notes (Signed)
Called contact on registration record who will pick up patient

## 2012-06-03 NOTE — ED Provider Notes (Signed)
Medical screening examination/treatment/procedure(s) were performed by non-physician practitioner and as supervising physician I was immediately available for consultation/collaboration.   Gavin Pound. Male Minish, MD 06/03/12 1549

## 2012-06-06 ENCOUNTER — Other Ambulatory Visit: Payer: Self-pay | Admitting: Physical Medicine & Rehabilitation

## 2012-06-08 ENCOUNTER — Other Ambulatory Visit: Payer: Self-pay | Admitting: Physical Medicine & Rehabilitation

## 2012-07-29 ENCOUNTER — Encounter
Payer: BC Managed Care – PPO | Attending: Physical Medicine & Rehabilitation | Admitting: Physical Medicine & Rehabilitation

## 2012-07-29 ENCOUNTER — Encounter: Payer: Self-pay | Admitting: Physical Medicine & Rehabilitation

## 2012-07-29 VITALS — BP 130/72 | HR 74 | Resp 14 | Ht 68.0 in | Wt 196.0 lb

## 2012-07-29 DIAGNOSIS — R569 Unspecified convulsions: Secondary | ICD-10-CM

## 2012-07-29 DIAGNOSIS — I6330 Cerebral infarction due to thrombosis of unspecified cerebral artery: Secondary | ICD-10-CM | POA: Insufficient documentation

## 2012-07-29 DIAGNOSIS — F329 Major depressive disorder, single episode, unspecified: Secondary | ICD-10-CM

## 2012-07-29 DIAGNOSIS — E889 Metabolic disorder, unspecified: Secondary | ICD-10-CM

## 2012-07-29 DIAGNOSIS — G8110 Spastic hemiplegia affecting unspecified side: Secondary | ICD-10-CM | POA: Insufficient documentation

## 2012-07-29 NOTE — Progress Notes (Signed)
Subjective:    Patient ID: Craig Parrish, male    DOB: July 22, 1955, 57 y.o.   MRN: [id number]  HPI  Craig Parrish is back regarding his cva and spastic right hemiparesis. He had good results with the botox for his wrist/hand. The hand has loosened up especially after he stretches. He is trying to be more diligent about his exercises. He is doing some walking, has gotten into the pool, etc. He denies any pain at present.   His mood is improved. He denies seizures.   Pain Inventory Average Pain 0 Pain Right Now 0 My pain is intermittent  In the last 24 hours, has pain interfered with the following? General activity 0 Relation with others 0 Enjoyment of life 0 What TIME of day is your pain at its worst? n/a Sleep (in general) Good  Pain is worse with: n/a Pain improves with: n/a Relief from Meds: n/a  Mobility walk without assistance how many minutes can you walk? 60 ability to climb steps?  yes do you drive?  no Do you have any goals in this area?  no  Function retired I need assistance with the following:  meal prep Do you have any goals in this area?  no  Neuro/Psych No problems in this area  Prior Studies Any changes since last visit?  no  Physicians involved in your care Any changes since last visit?  no   Family History  Problem Relation Age of Onset  . Heart disease Mother   . Diabetes Mother   . Heart disease Father    History   Social History  . Marital Status: Married    Spouse Name: N/A    Number of Children: N/A  . Years of Education: N/A   Social History Main Topics  . Smoking status: Never Smoker   . Smokeless tobacco: Never Used  . Alcohol Use: Yes     Comment: 2 x week  . Drug Use: No  . Sexually Active: None   Other Topics Concern  . None   Social History Narrative  . None   Past Surgical History  Procedure Laterality Date  . Appendectomy     Past Medical History  Diagnosis Date  . Diabetes mellitus   . Stroke   . Cancer    . Hypertension   . High cholesterol   . Seizures    BP 130/72  Pulse 74  Resp 14  Ht 5\' 8"  (1.727 m)  Wt 196 lb (88.905 kg)  BMI 29.81 kg/m2  SpO2 98%     Review of Systems  Musculoskeletal: Positive for gait problem.  All other systems reviewed and are negative.       Objective:   Physical Exam General: Alert and oriented x 3, No apparent distress  HEENT: Head is normocephalic, atraumatic, PERRLA, EOMI, sclera anicteric, oral mucosa pink and moist, dentition intact, ext ear canals clear,  Neck: Supple without JVD or lymphadenopathy  Heart: Reg rate and rhythm. No murmurs rubs or gallops  Chest: CTA bilaterally without wheezes, rales, or rhonchi; no distress  Abdomen: Soft, non-tender, non-distended, bowel sounds positive.  Extremities: No clubbing, cyanosis, or edema. Pulses are 2+  Skin: Clean and intact without signs of breakdown  Neuro: He is alert and oriented. RUE is 1/5. HF are 2. HAD are 3. KE 3-, KF 2+, ADF trace, APF is 1-2. DTR's are 3+ on the right. Tone is 1 at FDS, FDP. 1 PT, BR and Biceps 1-2. PECS 1. Lats 1, TM  2.. He has a mild right central 7 and tongue deviation. Sensation is 1 to 1+ on the right. 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 He was able to name a few simple objects although these words were sometimes slightly garbled. He was able to repeat words with the same pattern. He could tell me the date with multiple cues. He is apraxic with his movements also. He walks with some weight shift to the right and hyperextension of the right knee in stance. He circumducts excessively if he walks too fast Musculoskeletal: Full ROM, No pain with AROM or PROM in the neck, trunk, or extremities. Posture appropriate  Psych: Pt's affect is appropriate. Pt is cooperative  Assessment & Plan:   Assessment:  1. 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 Reactive depression- better with zoloft 3.  NIRDM- controlled   Plan:  1. Will set him up for another round of botox 300 units potentially to the right biceps, brachioradialis, triceps  FDP, FDS, and/or PT.   2. Continue with baclofen at 10mg  TID for now  3. Continue zoloft for mood. This has worked well for him.  4. Keppra 500mg  Q12 per Dr. Clelia Croft.  5.Encouraged an aquatic exercise program to work on gait technique and efficiency. He needs to work on the quality of his gait rather than the speed.   5. I will see him back for injections in a few weeks.30 minutes of face to face patient care time were spent during this visit. All questions were encouraged and answered.

## 2012-07-29 NOTE — Patient Instructions (Signed)
WORK ON YOUR WALKING TECHNIQUE AND FORM

## 2012-08-13 ENCOUNTER — Other Ambulatory Visit: Payer: Self-pay | Admitting: Physical Medicine & Rehabilitation

## 2012-09-03 ENCOUNTER — Encounter: Payer: BC Managed Care – PPO | Admitting: Physical Medicine & Rehabilitation

## 2012-12-10 ENCOUNTER — Encounter: Payer: Self-pay | Admitting: Physical Medicine & Rehabilitation

## 2012-12-10 ENCOUNTER — Encounter
Payer: BC Managed Care – PPO | Attending: Physical Medicine & Rehabilitation | Admitting: Physical Medicine & Rehabilitation

## 2012-12-10 VITALS — BP 123/73 | HR 77 | Resp 14 | Ht 67.0 in | Wt 198.8 lb

## 2012-12-10 DIAGNOSIS — G8110 Spastic hemiplegia affecting unspecified side: Secondary | ICD-10-CM

## 2012-12-10 DIAGNOSIS — E119 Type 2 diabetes mellitus without complications: Secondary | ICD-10-CM | POA: Insufficient documentation

## 2012-12-10 DIAGNOSIS — L89512 Pressure ulcer of right ankle, stage 2: Secondary | ICD-10-CM

## 2012-12-10 DIAGNOSIS — I6330 Cerebral infarction due to thrombosis of unspecified cerebral artery: Secondary | ICD-10-CM

## 2012-12-10 DIAGNOSIS — L8992 Pressure ulcer of unspecified site, stage 2: Secondary | ICD-10-CM

## 2012-12-10 DIAGNOSIS — I69920 Aphasia following unspecified cerebrovascular disease: Secondary | ICD-10-CM | POA: Insufficient documentation

## 2012-12-10 DIAGNOSIS — I69959 Hemiplegia and hemiparesis following unspecified cerebrovascular disease affecting unspecified side: Secondary | ICD-10-CM | POA: Insufficient documentation

## 2012-12-10 DIAGNOSIS — F341 Dysthymic disorder: Secondary | ICD-10-CM | POA: Insufficient documentation

## 2012-12-10 DIAGNOSIS — L89509 Pressure ulcer of unspecified ankle, unspecified stage: Secondary | ICD-10-CM | POA: Insufficient documentation

## 2012-12-10 NOTE — Patient Instructions (Signed)
PLEASE REFRAIN FROM BRACE WEAR UNTIL WOUND IS HEALED AND BRACE IS ADJUSTED.   YOU MAY USE NEOSPORIN AND BANDAID OVER WOUND FOR NOW.  CALL ME WITH ANY PROBLEMS OR QUESTIONS (#[phone]).  HAVE A GOOD DAY

## 2012-12-10 NOTE — Progress Notes (Signed)
Subjective:    Patient ID: Craig Parrish, male    DOB: 03-01-1955, 57 y.o.   MRN: [id number]  HPI  Mr. Specht is back regarding his cva and spastic right hemiparesis. He complains of pain which came on Wednesday last week. He denies any trauma or fall. His wife noticed his walking had changed. His wife however noticed that he had blood on his ankle, perhaps from a burst blister. Pt and wife deny any abrupt changes in gait, brace, etc. He's had the brace for about 2.5 years.     Pain Inventory Average Pain 3 Pain Right Now 3 My pain is intermittent and aching  In the last 24 hours, has pain interfered with the following? General activity 1 Relation with others 0 Enjoyment of life 0 What TIME of day is your pain at its worst? night Sleep (in general) Fair  Pain is worse with: walking Pain improves with: medication Relief from Meds: 10  Mobility walk without assistance how many minutes can you walk? 60 ability to climb steps?  yes do you drive?  no transfers alone Do you have any goals in this area?  no  Function retired I need assistance with the following:  meal prep, household duties and shopping Do you have any goals in this area?  no  Neuro/Psych weakness  Prior Studies Any changes since last visit?  no  Physicians involved in your care Any changes since last visit?  no   Family History  Problem Relation Age of Onset  . Heart disease Mother   . Diabetes Mother   . Heart disease Father    History   Social History  . Marital Status: Married    Spouse Name: N/A    Number of Children: N/A  . Years of Education: N/A   Social History Main Topics  . Smoking status: Never Smoker   . Smokeless tobacco: Never Used  . Alcohol Use: Yes     Comment: 2 x week  . Drug Use: No  . Sexual Activity: None   Other Topics Concern  . None   Social History Narrative  . None   Past Surgical History  Procedure Laterality Date  . Appendectomy     Past  Medical History  Diagnosis Date  . Diabetes mellitus   . Stroke   . Cancer   . Hypertension   . High cholesterol   . Seizures    BP 123/73  Pulse 77  Resp 14  Ht 5\' 7"  (1.702 m)  Wt 198 lb 12.8 oz (90.175 kg)  BMI 31.13 kg/m2  SpO2 97%     Review of Systems  Constitutional: Positive for unexpected weight change.  Neurological: Positive for weakness.       Objective:   Physical Exam  General: Alert and oriented x 3, No apparent distress  HEENT: Head is normocephalic, atraumatic, PERRLA, EOMI, sclera anicteric, oral mucosa pink and moist, dentition intact, ext ear canals clear,  Neck: Supple without JVD or lymphadenopathy  Heart: Reg rate and rhythm. No murmurs rubs or gallops  Chest: CTA bilaterally without wheezes, rales, or rhonchi; no distress  Abdomen: Soft, non-tender, non-distended, bowel sounds positive.  Extremities: No clubbing, cyanosis, or edema. Pulses are 2+  Skin: 1cm pressure sore along right lateral malleolus. Corresponds to a contact point with AFO. There was blood on his sock and the brace. No odor. Neuro: He is alert and oriented. RUE is 1/5. HF are 2. HAD are 3. KE 3-, KF  2+, ADF trace, APF is 1-2. DTR's are 3+ on the right. Tone is 1 at FDS, FDP. 1 PT, BR and Biceps 1-2. PECS 1. Lats 1, TM 2.. He has a mild right central 7 and tongue deviation. Sensation is 1 to 1+ on the right. 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 He was able to name a few simple objects although these words were sometimes slightly garbled. He was able to repeat words with the same pattern. He could tell me the date with multiple cues. He is apraxic with his movements also. He walks with some weight shift to the right and hyperextension of the right knee in stance. He circumducts excessively if he walks too fast. There is no excessive inversion of the ankle with weight bearing.  Musculoskeletal: Full ROM, No pain with AROM or PROM in the neck, trunk, or extremities.  Posture appropriate. No bony changes to the ankle are noted.  Psych: Pt's affect is appropriate. Pt is cooperative   Assessment & Plan:   Assessment:  1. 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 Reactive depression- better with zoloft  3. NIRDM- controlled    Plan:  1. Advised no AFO wear until the wound is healed.  [facility].  He will need to be more vigilant going forward.  2. Continue with baclofen at 10mg  TID b 3. Continue zoloft for mood. This has worked well for him.  4. Keppra 500mg  Q12 per Dr. Clelia Croft.  5. Botox are still an option if he chooses to use the hand.   6. Follow up with me in about 6 months or if problems should recur. 15 minutes of face to face patient care time were spent during this visit. All questions were encouraged and answered.

## 2013-04-02 ENCOUNTER — Telehealth: Payer: Self-pay

## 2013-04-02 DIAGNOSIS — I6330 Cerebral infarction due to thrombosis of unspecified cerebral artery: Secondary | ICD-10-CM

## 2013-04-02 DIAGNOSIS — G8110 Spastic hemiplegia affecting unspecified side: Secondary | ICD-10-CM

## 2013-04-02 DIAGNOSIS — L89509 Pressure ulcer of unspecified ankle, unspecified stage: Secondary | ICD-10-CM

## 2013-04-02 DIAGNOSIS — L8992 Pressure ulcer of unspecified site, stage 2: Secondary | ICD-10-CM

## 2013-04-02 NOTE — Telephone Encounter (Signed)
Patient wife called requesting a new rx for patients ankle brace.  He needs a new right AFO order faxed to [facility] [phone].  Please order.

## 2013-04-02 NOTE — Telephone Encounter (Signed)
Remind me tomorrow

## 2013-04-03 ENCOUNTER — Telehealth: Payer: Self-pay

## 2013-04-03 NOTE — Telephone Encounter (Signed)
Patient wife aware that order for AFO was sent to [facility].

## 2013-04-03 NOTE — Telephone Encounter (Signed)
Order for AFO placed.

## 2013-04-03 NOTE — Telephone Encounter (Signed)
Patient's wife called on his behalf to see if the RX for a right AFO brace had been order yet. She also left the fax number to [facility] [phone].

## 2013-04-13 ENCOUNTER — Telehealth: Payer: Self-pay

## 2013-04-13 NOTE — Telephone Encounter (Signed)
Patients wife called again requesting AFO order be faxed to [phone].  She says the facility had not received the order and the patients current AFO is broken.  Order printed and faxed again to facility.  Left message advising patient wife.

## 2013-04-28 DIAGNOSIS — R030 Elevated blood-pressure reading, without diagnosis of hypertension: Secondary | ICD-10-CM | POA: Diagnosis not present

## 2013-05-05 ENCOUNTER — Encounter: Payer: Self-pay | Admitting: Neurology

## 2013-05-05 ENCOUNTER — Ambulatory Visit (INDEPENDENT_AMBULATORY_CARE_PROVIDER_SITE_OTHER): Payer: Medicare Other | Admitting: Neurology

## 2013-05-05 ENCOUNTER — Encounter (INDEPENDENT_AMBULATORY_CARE_PROVIDER_SITE_OTHER): Payer: Self-pay

## 2013-05-05 VITALS — BP 109/68 | HR 70 | Ht 70.0 in | Wt 280.0 lb

## 2013-05-05 DIAGNOSIS — R569 Unspecified convulsions: Secondary | ICD-10-CM | POA: Diagnosis not present

## 2013-05-05 DIAGNOSIS — F32A Depression, unspecified: Secondary | ICD-10-CM

## 2013-05-05 DIAGNOSIS — F329 Major depressive disorder, single episode, unspecified: Secondary | ICD-10-CM | POA: Diagnosis not present

## 2013-05-05 DIAGNOSIS — E889 Metabolic disorder, unspecified: Secondary | ICD-10-CM

## 2013-05-05 DIAGNOSIS — L8990 Pressure ulcer of unspecified site, unspecified stage: Secondary | ICD-10-CM

## 2013-05-05 DIAGNOSIS — G8110 Spastic hemiplegia affecting unspecified side: Secondary | ICD-10-CM

## 2013-05-05 DIAGNOSIS — L89509 Pressure ulcer of unspecified ankle, unspecified stage: Secondary | ICD-10-CM

## 2013-05-05 DIAGNOSIS — F3289 Other specified depressive episodes: Secondary | ICD-10-CM

## 2013-05-05 DIAGNOSIS — I6330 Cerebral infarction due to thrombosis of unspecified cerebral artery: Secondary | ICD-10-CM | POA: Diagnosis not present

## 2013-05-05 MED ORDER — LEVETIRACETAM 500 MG PO TABS: 500.0000 mg | ORAL_TABLET | Freq: Two times a day (BID) | ORAL | Status: DC

## 2013-05-05 NOTE — Progress Notes (Addendum)
PATIENT: Craig Parrish DOB: 09/07/1955  HISTORICAL  Craig Parrish is a 58 years old right-handed Caucasian male, referred by his primary care physician, accompanied by his wife, to followup, last clinical visit was in October 2013  He suffered stroke March 06 2010, involving left MCA, was treated and, he had residual global aphasia, spastic right hemiparesis, ambulate with right rigid ankle brace, and Prograf he also suffered a seizure in September 26 2011, has been taking Keppra ever since, he supposed to take Keppra 500 mg twice a day, instead he is only taking 500 milligrams every morning, there was no recurrent seizure  The etiology for his stroke was likely due to a PFO,No procedure was performed. he is currently taking Plavix, also had vascular risk factor of hypertension, hyperlipidemia, diabetes  He is also on to do pain management of Dr. Hulan Amato, previously received a Botox injection for his right spastic upper extremity, there was no noticeable benefit, he does not use his right arm anymore, has not received a Botox injection for a while     REVIEW OF SYSTEMS: Full 14 system review of systems performed and notable only for spastic right hemiparesis, gait difficulty,  ALLERGIES: Allergies  Allergen Reactions  . Aspirin   . Other     Mushrooms  . Penicillins     HOME MEDICATIONS: Current Outpatient Prescriptions on File Prior to Visit  Medication Sig Dispense Refill  . ACCU-CHEK AVIVA PLUS test strip       . atorvastatin (LIPITOR) 20 MG tablet Take 20 mg by mouth at bedtime.       . baclofen (LIORESAL) 10 MG tablet TAKE 1/2 TABLET BY MOUTH THREE TIMES DAILY FOR 7 DAYS, THEN 1 THREE TIMES DAILY X 7 DAYS, THEN 1 FOUR TIMES DAILY THEREAFTER  120 tablet  0  . BOTOX 100 UNITS SOLR       . clopidogrel (PLAVIX) 75 MG tablet Take 75 mg by mouth daily.       . fish oil-omega-3 fatty acids 1000 MG capsule Take 1 g by mouth 2 (two) times daily.      Marland Kitchen levETIRAcetam  (KEPPRA) 500 MG tablet Take 500 mg by mouth daily.      . metFORMIN (GLUCOPHAGE) 500 MG tablet Take 500 mg by mouth every evening.       . sertraline (ZOLOFT) 25 MG tablet TAKE 1 TABLET BY MOUTH DAILY  30 tablet  3   No current facility-administered medications on file prior to visit.    PAST MEDICAL HISTORY: Past Medical History  Diagnosis Date  . Diabetes mellitus   . Stroke   . Cancer   . Hypertension   . High cholesterol   . Seizures     PAST SURGICAL HISTORY: Past Surgical History  Procedure Laterality Date  . Appendectomy      FAMILY HISTORY: Family History  Problem Relation Age of Onset  . Heart disease Mother   . Diabetes Mother   . Heart disease Father     SOCIAL HISTORY:  History   Social History  . Marital Status: Married    Spouse Name: N/A    Number of Children: N/A  . Years of Education: N/A   Occupational History  . Not on file.   Social History Main Topics  . Smoking status: Never Smoker   . Smokeless tobacco: Never Used  . Alcohol Use: Yes     Comment: 2 x week  . Drug Use: No  . Sexual Activity:  Not on file   Other Topics Concern  . Not on file   Social History Narrative  . No narrative on file     PHYSICAL EXAM   Filed Vitals:   05/05/13 1315  BP: 109/68  Pulse: 70  Height: 5\' 10"  (1.778 m)  Weight: 280 lb (127.007 kg)    Not recorded    Body mass index is 40.18 kg/(m^2).   Generalized: In no acute distress  Neck: Supple, no carotid bruits   Cardiac: Regular rate rhythm  Pulmonary: Clear to auscultation bilaterally  Musculoskeletal: No deformity  Neurological examination  Mentation: Alert oriented to time, place, history taking, and causual conversation  Cranial nerve II-XII: Pupils were equal round reactive to light. Extraocular movements were full.  Right hemifield loss,. Bilateral fundi were sharp.  He has mild right lower face weakness,. Hearing was intact to finger rubbing bilaterally. Uvula tongue  midline. Decreased right shoulder shrugging.Tongue protrusion into cheek strength was normal.  Motor: spastic right hemiparesis, there was no antigravity movement of right upper extremity, right lower extremity proximal and distal strength 3 + out of 5  Sensory: Intact to fine touch, pinprick, preserved vibratory sensation, and proprioception at toes.  Coordination: Normal finger to nose, heel-to-shin bilaterally there was no truncal ataxia  Gait: spastic right hemi-circumferential gait  Romberg signs: Negative  Deep tendon reflexes: hyperreflexiaof right side  DIAGNOSTIC DATA (LABS, IMAGING, TESTING) - I reviewed patient records, labs, notes, testing and imaging myself where available.  Lab Results  Component Value Date   WBC 6.6 09/26/2011   HGB 14.7 09/26/2011   HCT 42.7 09/26/2011   MCV 88.4 09/26/2011   PLT 158 09/26/2011      Component Value Date/Time   NA 141 09/26/2011 1159   K 4.3 09/26/2011 1159   CL 104 09/26/2011 1159   CO2 24 09/26/2011 1159   GLUCOSE 149 09/26/2011 1159   BUN 13 09/26/2011 1159   CREATININE 0.99 09/26/2011 1159   CALCIUM 9.7 09/26/2011 1159   PROT 6.6 09/26/2011 1159   ALBUMIN 3.9 09/26/2011 1159   AST 13 09/26/2011 1159   ALT 16 09/26/2011 1159   ALKPHOS 66 09/26/2011 1159   BILITOT 0.5 09/26/2011 1159   GFRNONAA 88 09/26/2011 1159   GFRAA >90 09/26/2011 1159   ASSESSMENT AND PLAN  Craig Parrish is a 58 y.o. male with past medical history of left MCA stroke, with residual global aphasia, spastic right hemiparesis, history of 1 seizure,  Continue Keppra 500mg  twice a day, he is to continue refill  with his primary  care doctor   [name], M.D. Ph.D.  [facility] [address] [place], [place] [address] [phone]

## 2013-06-10 ENCOUNTER — Encounter: Payer: BC Managed Care – PPO | Admitting: Physical Medicine & Rehabilitation

## 2013-07-06 DIAGNOSIS — H2510 Age-related nuclear cataract, unspecified eye: Secondary | ICD-10-CM | POA: Diagnosis not present

## 2013-07-06 DIAGNOSIS — E119 Type 2 diabetes mellitus without complications: Secondary | ICD-10-CM | POA: Diagnosis not present

## 2013-07-21 DIAGNOSIS — J069 Acute upper respiratory infection, unspecified: Secondary | ICD-10-CM | POA: Diagnosis not present

## 2013-07-28 ENCOUNTER — Encounter: Payer: BC Managed Care – PPO | Admitting: Physical Medicine & Rehabilitation

## 2013-08-04 DIAGNOSIS — Z1331 Encounter for screening for depression: Secondary | ICD-10-CM | POA: Diagnosis not present

## 2013-08-04 DIAGNOSIS — G609 Hereditary and idiopathic neuropathy, unspecified: Secondary | ICD-10-CM | POA: Diagnosis not present

## 2013-08-04 DIAGNOSIS — E669 Obesity, unspecified: Secondary | ICD-10-CM | POA: Diagnosis not present

## 2013-08-04 DIAGNOSIS — I69959 Hemiplegia and hemiparesis following unspecified cerebrovascular disease affecting unspecified side: Secondary | ICD-10-CM | POA: Diagnosis not present

## 2013-08-04 DIAGNOSIS — I10 Essential (primary) hypertension: Secondary | ICD-10-CM | POA: Diagnosis not present

## 2013-08-04 DIAGNOSIS — R569 Unspecified convulsions: Secondary | ICD-10-CM | POA: Diagnosis not present

## 2013-08-04 DIAGNOSIS — E1149 Type 2 diabetes mellitus with other diabetic neurological complication: Secondary | ICD-10-CM | POA: Diagnosis not present

## 2013-08-04 DIAGNOSIS — E785 Hyperlipidemia, unspecified: Secondary | ICD-10-CM | POA: Diagnosis not present

## 2013-08-04 DIAGNOSIS — R4701 Aphasia: Secondary | ICD-10-CM | POA: Diagnosis not present

## 2013-09-11 ENCOUNTER — Encounter: Payer: Self-pay | Admitting: Physical Medicine & Rehabilitation

## 2013-09-11 ENCOUNTER — Encounter: Payer: Medicare Other | Attending: Physical Medicine & Rehabilitation | Admitting: Physical Medicine & Rehabilitation

## 2013-09-11 VITALS — BP 119/70 | HR 79 | Resp 14 | Wt 194.0 lb

## 2013-09-11 DIAGNOSIS — G8110 Spastic hemiplegia affecting unspecified side: Secondary | ICD-10-CM

## 2013-09-11 DIAGNOSIS — I6330 Cerebral infarction due to thrombosis of unspecified cerebral artery: Secondary | ICD-10-CM

## 2013-09-11 DIAGNOSIS — I69959 Hemiplegia and hemiparesis following unspecified cerebrovascular disease affecting unspecified side: Secondary | ICD-10-CM | POA: Insufficient documentation

## 2013-09-11 NOTE — Patient Instructions (Signed)
PLEASE CALL ME WITH ANY PROBLEMS OR QUESTIONS (#[phone]).     WORK ON GOOD TECHNIQUE AND POSTURE WITH YOUR GAIT

## 2013-09-11 NOTE — Progress Notes (Signed)
Subjective:    Patient ID: Craig Parrish, male    DOB: Nov 13, 1955, 58 y.o.   MRN: [id number]  HPI  Craig Parrish is back regarding his left MCA infarct. For the most part he's been doing well. Since I last saw him he's worked on his gait. He just received a new AFO a few months ago. He stretches his right hand daily. There is some spasticity in the wrist, but it tends to come and go.    Pain Inventory Average Pain 0 Pain Right Now 0 My pain is no pain  In the last 24 hours, has pain interfered with the following? General activity 0 Relation with others 0 Enjoyment of life 0 What TIME of day is your pain at its worst? no pain Sleep (in general) no pain  Pain is worse with: no pain Pain improves with: no pain Relief from Meds: no pain  Mobility walk without assistance  Function disabled: date disabled .  Neuro/Psych No problems in this area  Prior Studies Any changes since last visit?  no  Physicians involved in your care Any changes since last visit?  no   Family History  Problem Relation Age of Onset  . Heart disease Mother   . Diabetes Mother   . Heart disease Father    History   Social History  . Marital Status: Married    Spouse Name: N/A    Number of Children: N/A  . Years of Education: N/A   Social History Main Topics  . Smoking status: Never Smoker   . Smokeless tobacco: Never Used  . Alcohol Use: Yes     Comment: 2 x week  . Drug Use: No  . Sexual Activity: None   Other Topics Concern  . None   Social History Narrative  . None   Past Surgical History  Procedure Laterality Date  . Appendectomy     Past Medical History  Diagnosis Date  . Diabetes mellitus   . Stroke   . Cancer   . Hypertension   . High cholesterol   . Seizures    BP 119/70  Parrish 79  Resp 14  Wt 194 lb (87.998 kg)  SpO2 97%  Opioid Risk Score:   Fall Risk Score: Moderate Fall Risk (6-13 points) (educated and given handout for fall prevention in the home)     Review of Systems  All other systems reviewed and are negative.      Objective:   Physical Exam  General: Alert and oriented x 3, No apparent distress  HEENT: Head is normocephalic, atraumatic, PERRLA, EOMI, sclera anicteric, oral mucosa pink and moist, dentition intact, ext ear canals clear,  Neck: Supple without JVD or lymphadenopathy  Heart: Reg rate and rhythm. No murmurs rubs or gallops  Chest: CTA bilaterally without wheezes, rales, or rhonchi; no distress  Abdomen: Soft, non-tender, non-distended, bowel sounds positive.  Extremities: No clubbing, cyanosis, or edema. Pulses are 2+  Skin: 1cm pressure sore along right lateral malleolus. Corresponds to a contact point with AFO. There was blood on his sock and the brace. No odor.  Neuro: He is alert and oriented. RUE is 1/5. HF are 2. HAD are 3. KE 3-, KF 2+, ADF trace, APF is 1-2. DTR's are 3+ on the right. Tone is 1-2 at FDS, FDP. 1 PT, BR and Biceps 1. PECS 1. Lats 1, TM 2.. He has a mild right central 7 and tongue deviation. Sensation is 1 to 1+ on the right. Speech  is expressively aphasic and he also has apraxia.   He walks with some weight shift to the right and hyperextension of the right knee still in stance. He circumducts less and displays better hip flexion overall. Musculoskeletal: Full ROM, No pain with AROM or PROM in the neck, trunk, or extremities. Posture appropriate. No bony changes to the ankle are noted.  Psych: Pt's affect is appropriate. Pt is cooperative. In good spirits   Assessment & Plan:   Assessment:  1. 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 Reactive depression- improved with zoloft  3. NIRDM- controlled   Plan:  1. Wrote rx for a resting WHO to wear at night. I don't believe he needs botox at this time as his tone broke easily.  2. Continue with baclofen at 10mg  TID b  3. Continue zoloft for mood. This has worked well for him.  4. Keppra 500mg   Q12 per Dr. Brigitte Parrish.  5. Discussed the fact that I would like to see him work further on good technique and form with his walking. i think he could further reduce the circumduction with gait.  6. Follow up with me in about 12 months or if problems should recur. 15 minutes of face to face patient care time were spent during this visit. All questions were encouraged and answered.

## 2014-01-20 DIAGNOSIS — Z0000 Encounter for general adult medical examination without abnormal findings: Secondary | ICD-10-CM | POA: Diagnosis not present

## 2014-01-20 DIAGNOSIS — E785 Hyperlipidemia, unspecified: Secondary | ICD-10-CM | POA: Diagnosis not present

## 2014-01-20 DIAGNOSIS — I10 Essential (primary) hypertension: Secondary | ICD-10-CM | POA: Diagnosis not present

## 2014-01-20 DIAGNOSIS — Z125 Encounter for screening for malignant neoplasm of prostate: Secondary | ICD-10-CM | POA: Diagnosis not present

## 2014-01-20 DIAGNOSIS — E1149 Type 2 diabetes mellitus with other diabetic neurological complication: Secondary | ICD-10-CM | POA: Diagnosis not present

## 2014-01-28 DIAGNOSIS — Z125 Encounter for screening for malignant neoplasm of prostate: Secondary | ICD-10-CM | POA: Diagnosis not present

## 2014-01-28 DIAGNOSIS — Z0000 Encounter for general adult medical examination without abnormal findings: Secondary | ICD-10-CM | POA: Diagnosis not present

## 2014-01-28 DIAGNOSIS — G629 Polyneuropathy, unspecified: Secondary | ICD-10-CM | POA: Diagnosis not present

## 2014-01-28 DIAGNOSIS — Z1389 Encounter for screening for other disorder: Secondary | ICD-10-CM | POA: Diagnosis not present

## 2014-01-28 DIAGNOSIS — R569 Unspecified convulsions: Secondary | ICD-10-CM | POA: Diagnosis not present

## 2014-01-28 DIAGNOSIS — I10 Essential (primary) hypertension: Secondary | ICD-10-CM | POA: Diagnosis not present

## 2014-01-28 DIAGNOSIS — E1149 Type 2 diabetes mellitus with other diabetic neurological complication: Secondary | ICD-10-CM | POA: Diagnosis not present

## 2014-01-28 DIAGNOSIS — Z6830 Body mass index (BMI) 30.0-30.9, adult: Secondary | ICD-10-CM | POA: Diagnosis not present

## 2014-01-28 DIAGNOSIS — I69959 Hemiplegia and hemiparesis following unspecified cerebrovascular disease affecting unspecified side: Secondary | ICD-10-CM | POA: Diagnosis not present

## 2014-01-28 DIAGNOSIS — F801 Expressive language disorder: Secondary | ICD-10-CM | POA: Diagnosis not present

## 2014-01-28 DIAGNOSIS — E669 Obesity, unspecified: Secondary | ICD-10-CM | POA: Diagnosis not present

## 2014-01-28 DIAGNOSIS — E785 Hyperlipidemia, unspecified: Secondary | ICD-10-CM | POA: Diagnosis not present

## 2014-03-17 ENCOUNTER — Other Ambulatory Visit: Payer: Self-pay | Admitting: Neurology

## 2014-05-24 DIAGNOSIS — J020 Streptococcal pharyngitis: Secondary | ICD-10-CM | POA: Diagnosis not present

## 2014-05-24 DIAGNOSIS — J029 Acute pharyngitis, unspecified: Secondary | ICD-10-CM | POA: Diagnosis not present

## 2014-05-24 DIAGNOSIS — Z6829 Body mass index (BMI) 29.0-29.9, adult: Secondary | ICD-10-CM | POA: Diagnosis not present

## 2014-05-24 DIAGNOSIS — R509 Fever, unspecified: Secondary | ICD-10-CM | POA: Diagnosis not present

## 2014-06-02 DIAGNOSIS — E1149 Type 2 diabetes mellitus with other diabetic neurological complication: Secondary | ICD-10-CM | POA: Diagnosis not present

## 2014-06-02 DIAGNOSIS — F801 Expressive language disorder: Secondary | ICD-10-CM | POA: Diagnosis not present

## 2014-06-02 DIAGNOSIS — I69959 Hemiplegia and hemiparesis following unspecified cerebrovascular disease affecting unspecified side: Secondary | ICD-10-CM | POA: Diagnosis not present

## 2014-06-02 DIAGNOSIS — Z1389 Encounter for screening for other disorder: Secondary | ICD-10-CM | POA: Diagnosis not present

## 2014-06-02 DIAGNOSIS — R569 Unspecified convulsions: Secondary | ICD-10-CM | POA: Diagnosis not present

## 2014-06-02 DIAGNOSIS — E785 Hyperlipidemia, unspecified: Secondary | ICD-10-CM | POA: Diagnosis not present

## 2014-06-02 DIAGNOSIS — G629 Polyneuropathy, unspecified: Secondary | ICD-10-CM | POA: Diagnosis not present

## 2014-06-02 DIAGNOSIS — E669 Obesity, unspecified: Secondary | ICD-10-CM | POA: Diagnosis not present

## 2014-06-02 DIAGNOSIS — Z6829 Body mass index (BMI) 29.0-29.9, adult: Secondary | ICD-10-CM | POA: Diagnosis not present

## 2014-06-02 DIAGNOSIS — I10 Essential (primary) hypertension: Secondary | ICD-10-CM | POA: Diagnosis not present

## 2014-06-15 ENCOUNTER — Other Ambulatory Visit: Payer: Self-pay | Admitting: Neurology

## 2014-06-15 NOTE — Telephone Encounter (Signed)
Last OV note says: Continue Keppra 500mg  twice a day, he is to continue refill with his primary care doctor

## 2014-08-09 DIAGNOSIS — E119 Type 2 diabetes mellitus without complications: Secondary | ICD-10-CM | POA: Diagnosis not present

## 2014-08-09 DIAGNOSIS — H2513 Age-related nuclear cataract, bilateral: Secondary | ICD-10-CM | POA: Diagnosis not present

## 2014-08-20 IMAGING — CR DG CHEST 2V
2 series · 2 of 2 positions shown · non-contrast
Comparison: None

CLINICAL DATA: Seizures.

CHEST - 2 VIEW

[w chest lat]
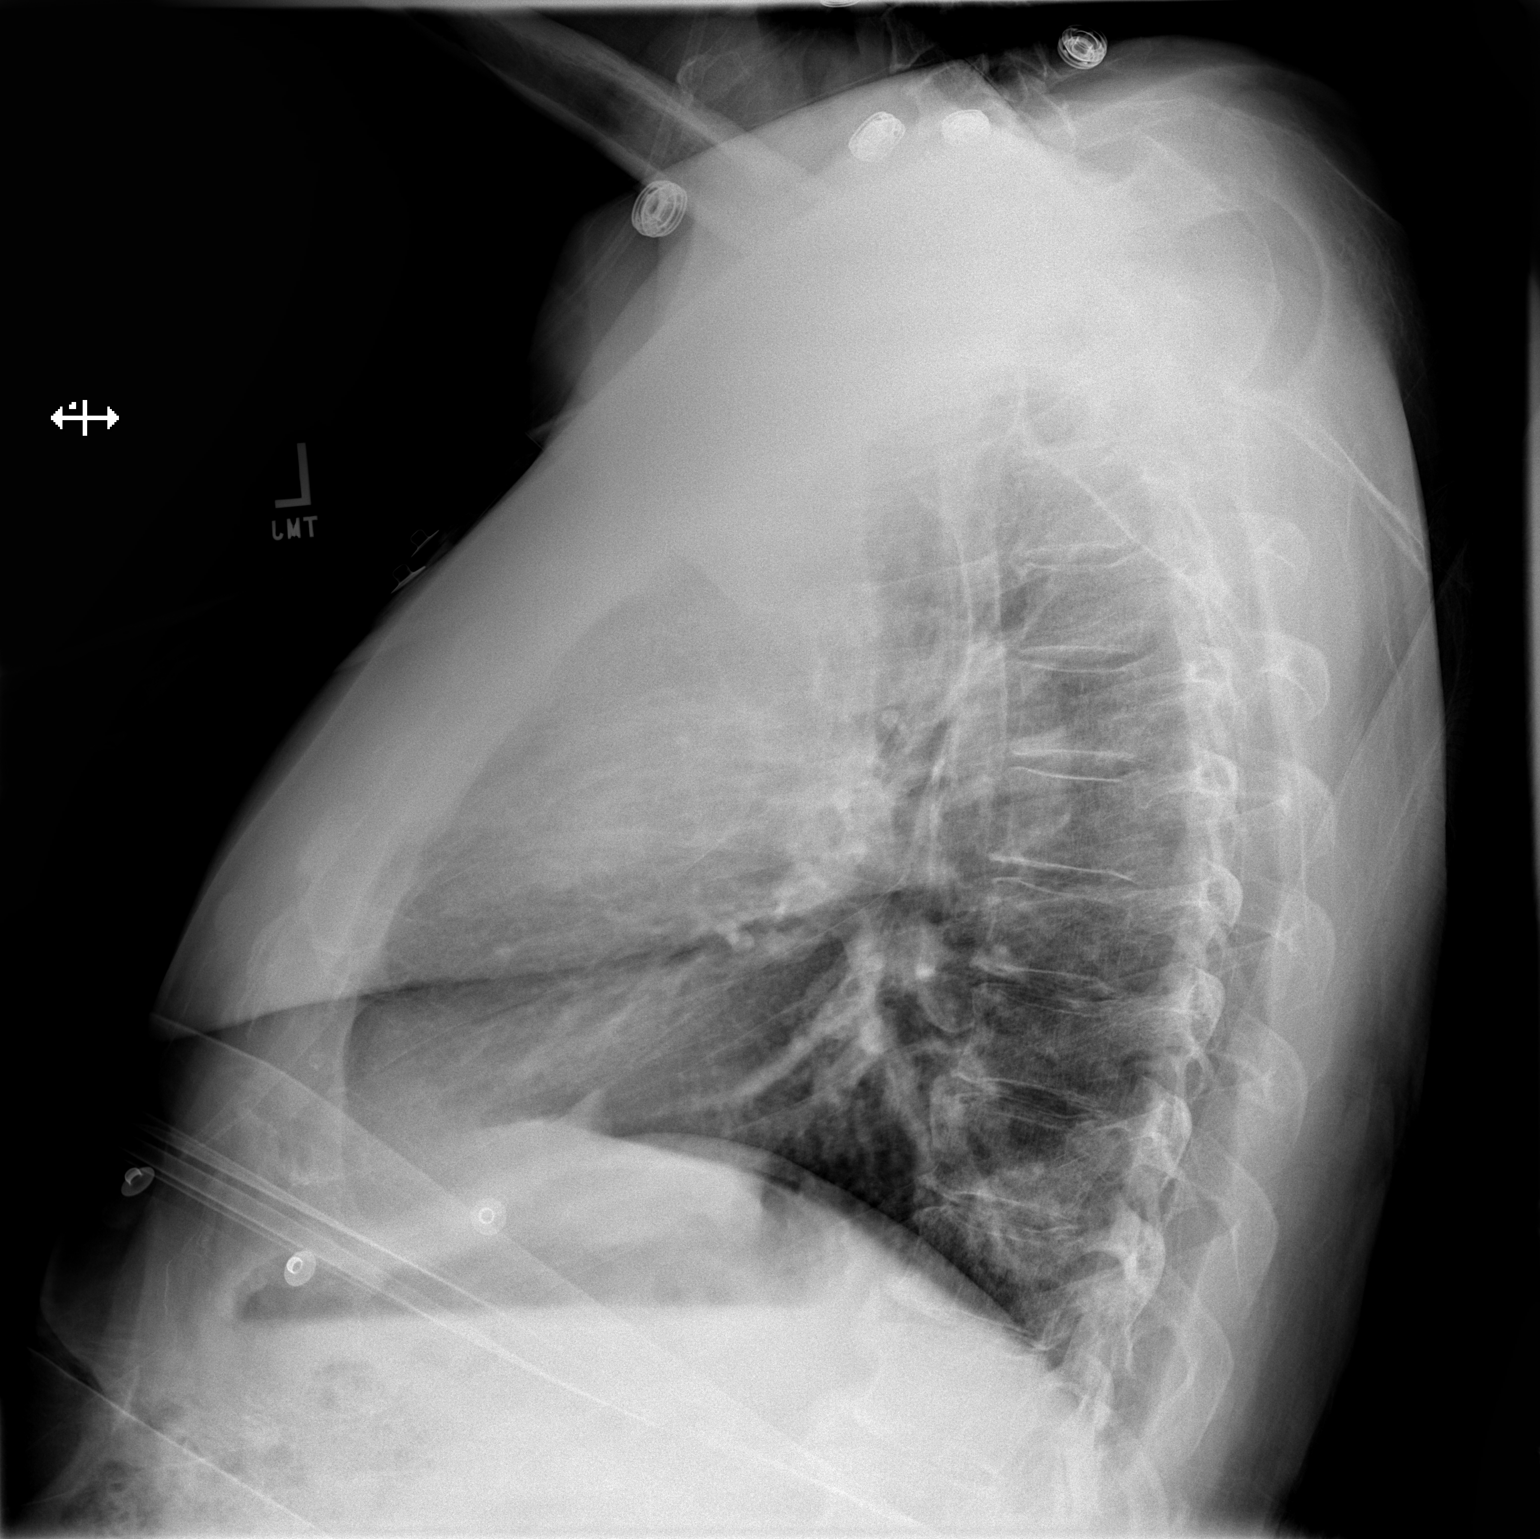

[x chest ap]
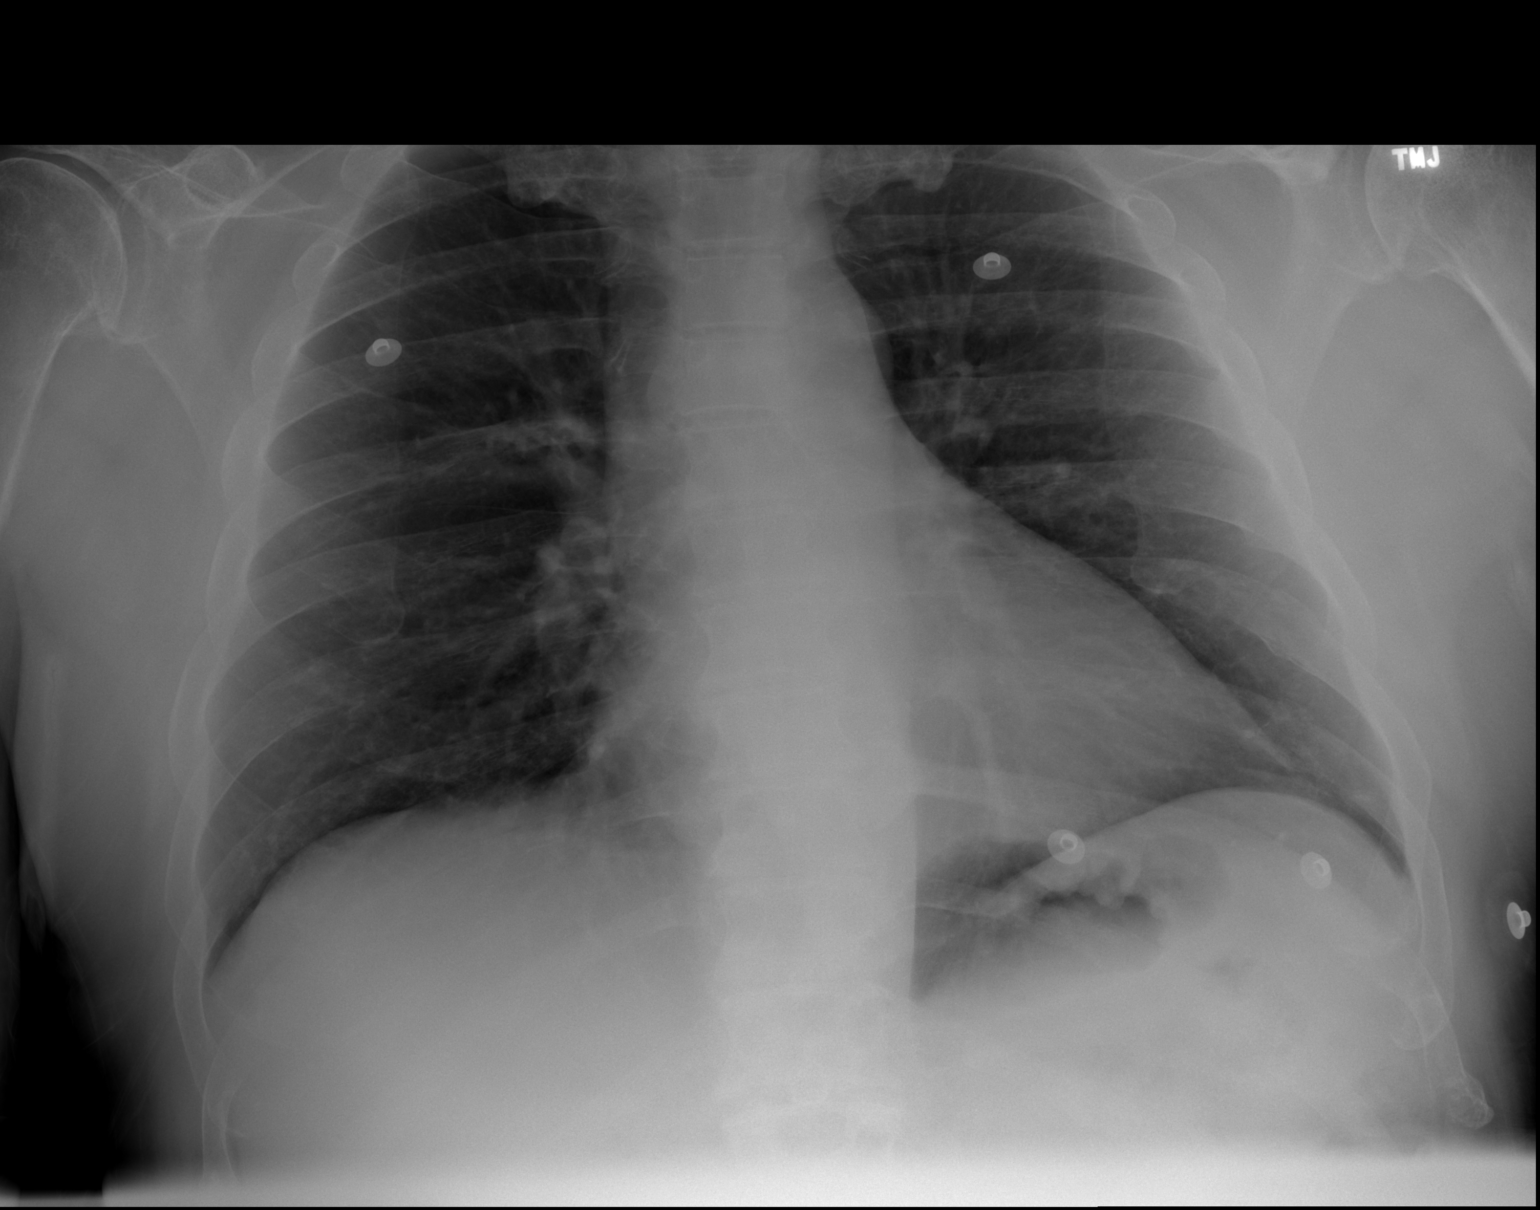

[2 of 2 positions shown; findings below may reference images not displayed]

FINDINGS: The cardiac silhouette, mediastinal and hilar contours
are within normal limits.  The lungs are clear.  No pleural
effusion.  The bony thorax is intact.
IMPRESSION: No acute cardiopulmonary findings.

## 2014-09-07 ENCOUNTER — Encounter: Payer: Self-pay | Admitting: Physical Medicine & Rehabilitation

## 2014-09-07 ENCOUNTER — Ambulatory Visit
Admission: RE | Admit: 2014-09-07 | Discharge: 2014-09-07 | Disposition: A | Discharge status: Home / Self Care | Payer: Medicare Other | Source: Ambulatory Visit | Attending: Physical Medicine & Rehabilitation | Admitting: Physical Medicine & Rehabilitation

## 2014-09-07 ENCOUNTER — Other Ambulatory Visit: Payer: Self-pay | Admitting: Physical Medicine & Rehabilitation

## 2014-09-07 ENCOUNTER — Encounter: Payer: Medicare Other | Attending: Physical Medicine & Rehabilitation | Admitting: Physical Medicine & Rehabilitation

## 2014-09-07 VITALS — BP 116/70 | HR 71 | Resp 14

## 2014-09-07 DIAGNOSIS — I10 Essential (primary) hypertension: Secondary | ICD-10-CM | POA: Insufficient documentation

## 2014-09-07 DIAGNOSIS — I69351 Hemiplegia and hemiparesis following cerebral infarction affecting right dominant side: Secondary | ICD-10-CM | POA: Insufficient documentation

## 2014-09-07 DIAGNOSIS — M79631 Pain in right forearm: Secondary | ICD-10-CM | POA: Diagnosis not present

## 2014-09-07 DIAGNOSIS — I6330 Cerebral infarction due to thrombosis of unspecified cerebral artery: Secondary | ICD-10-CM

## 2014-09-07 DIAGNOSIS — S52101A Unspecified fracture of upper end of right radius, initial encounter for closed fracture: Secondary | ICD-10-CM

## 2014-09-07 DIAGNOSIS — E780 Pure hypercholesterolemia: Secondary | ICD-10-CM | POA: Insufficient documentation

## 2014-09-07 DIAGNOSIS — M25521 Pain in right elbow: Secondary | ICD-10-CM | POA: Insufficient documentation

## 2014-09-07 DIAGNOSIS — I69320 Aphasia following cerebral infarction: Secondary | ICD-10-CM | POA: Insufficient documentation

## 2014-09-07 DIAGNOSIS — G8110 Spastic hemiplegia affecting unspecified side: Secondary | ICD-10-CM

## 2014-09-07 DIAGNOSIS — E119 Type 2 diabetes mellitus without complications: Secondary | ICD-10-CM | POA: Insufficient documentation

## 2014-09-07 DIAGNOSIS — F329 Major depressive disorder, single episode, unspecified: Secondary | ICD-10-CM | POA: Diagnosis not present

## 2014-09-07 DIAGNOSIS — M7989 Other specified soft tissue disorders: Secondary | ICD-10-CM | POA: Diagnosis not present

## 2014-09-07 DIAGNOSIS — W19XXXA Unspecified fall, initial encounter: Secondary | ICD-10-CM | POA: Diagnosis not present

## 2014-09-07 NOTE — Progress Notes (Signed)
Subjective:    Patient ID: Craig Parrish, male    DOB: 12-27-1955, 59 y.o.   MRN: [id number]  HPI   Mr. Ogas is here in follow up of his left MCA infarct and spastic right hemiparesis. He had a fall last week after drinking 5 beers. His wife heard a "thump" on the floor and found him lying on the floor. He has had right arm pain since the fall.  The pain has been focused more so on the right elbow and forearm.  For pain relief. He has used some ibuprofen.   His gait has been "stiffer" since the fall.     Pain Inventory Average Pain 1 Pain Right Now 6 My pain is intermittent, dull and aching  In the last 24 hours, has pain interfered with the following? General activity 2 Relation with others 0 Enjoyment of life 1 What TIME of day is your pain at its worst? evening Sleep (in general) Good  Pain is worse with: standing Pain improves with: rest, heat/ice and medication Relief from Meds: 8  Mobility walk without assistance how many minutes can you walk? 45+ ability to climb steps?  yes do you drive?  no Do you have any goals in this area?  yes  Function retired I need assistance with the following:  meal prep and shopping  Neuro/Psych weakness numbness  Prior Studies Any changes since last visit?  no  Physicians involved in your care Any changes since last visit?  no   Family History  Problem Relation Age of Onset  . Heart disease Mother   . Diabetes Mother   . Heart disease Father    Social History   Social History  . Marital Status: Married    Spouse Name: N/A  . Number of Children: N/A  . Years of Education: N/A   Social History Main Topics  . Smoking status: Never Smoker   . Smokeless tobacco: Never Used  . Alcohol Use: Yes     Comment: 2 x week  . Drug Use: No  . Sexual Activity: Not Asked   Other Topics Concern  . None   Social History Narrative   Past Surgical History  Procedure Laterality Date  . Appendectomy     Past  Medical History  Diagnosis Date  . Diabetes mellitus   . Stroke   . Cancer   . Hypertension   . High cholesterol   . Seizures    BP 116/70 mmHg  Pulse 71  Resp 14  SpO2 99%  Opioid Risk Score:   Fall Risk Score:  `1  Depression screen PHQ 2/9  No flowsheet data found.   Review of Systems  Neurological: Positive for weakness and numbness.  All other systems reviewed and are negative.      Objective:   Physical Exam  General: Alert and oriented x 3, No apparent distress  HEENT: Head is normocephalic, atraumatic, PERRLA, EOMI, sclera anicteric, oral mucosa pink and moist, dentition intact, ext ear canals clear,  Neck: Supple without JVD or lymphadenopathy  Heart: Reg rate and rhythm. No murmurs rubs or gallops  Chest: CTA bilaterally without wheezes, rales, or rhonchi; no distress  Abdomen: Soft, non-tender, non-distended, bowel sounds positive.  Extremities: No clubbing, cyanosis, or edema. Pulses are 2+  Skin: 1cm pressure sore along right lateral malleolus. Corresponds to a contact point with AFO. There was blood on his sock and the brace. No odor.  Neuro: He is alert and oriented. RUE is 1/5.  HF are 2. HAD are 3. KE 3-, KF 2+, ADF trace, APF is 1-2. DTR's are 3+ on the right. Tone is 1-2 at FDS, FDP. 1 PT, BR and Biceps 1. PECS 1. Lats 1, TM 2.. He has a mild right central 7 and tongue deviation. Sensation is 1 to 1+ on the right. 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 He walks with some weight shift to the right and hyperextension of the right knee still in stance. He circumducts less Musculoskeletal:  Has mild bruising in proximal arm and near antecubital fossa. he has terrific tenderness with rotation of the forearm and with palpation over the right radius. Right shoulder moderately tender with IR/Er and rotator cuff maneuvers. Posture appropriate. No bony changes to the ankle are noted.  Psych: Pt's affect is appropriate. Pt is cooperative. In  good spirits   Assessment & Plan:   Assessment:  1. 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 Reactive depression- improved with zoloft  3. NIRDM- controlled  4. Fall with acute right elbow, proximal forearm pain. ?proximal radial fracture vs contusion  Plan:  1. Will send for xrays of right elbow,radius to assess for fracture. In the meantime recommended ice, limited use of ibuprofen, and relative rest. 2. Continue with baclofen at 10mg  TID   3. Continue zoloft for mood.   4. Keppra 500mg  Q12 per Dr. Brigitte Pulse.  5. Discussed use of EtOH and safety issues from a balance standpoint and as they relate to general health/seizure risk 6. Follow up with me in about 2 months or sooner pending xrays. 15 minutes of face to face patient care time were spent during this visit. All questions were encouraged and answered.

## 2014-09-07 NOTE — Patient Instructions (Signed)
AGGRESSIVE ICE, LIMITED IBUPROFEN (400MG ) TWICE DAILY FOR ONE WEEK.   YOU MAY BE AGGRESSIVE WITH TYLENOL UP TO 2500MG  DAILY.

## 2014-09-09 ENCOUNTER — Telehealth: Payer: Self-pay | Admitting: Physical Medicine & Rehabilitation

## 2014-09-09 NOTE — Telephone Encounter (Signed)
Called and spoke with wife. Elbow xrays appear negative. She has been icing his arm and it's actually feeling better. Too----consvt care for now

## 2014-09-30 DIAGNOSIS — Z6829 Body mass index (BMI) 29.0-29.9, adult: Secondary | ICD-10-CM | POA: Diagnosis not present

## 2014-09-30 DIAGNOSIS — E669 Obesity, unspecified: Secondary | ICD-10-CM | POA: Diagnosis not present

## 2014-09-30 DIAGNOSIS — E1149 Type 2 diabetes mellitus with other diabetic neurological complication: Secondary | ICD-10-CM | POA: Diagnosis not present

## 2014-09-30 DIAGNOSIS — I10 Essential (primary) hypertension: Secondary | ICD-10-CM | POA: Diagnosis not present

## 2014-09-30 DIAGNOSIS — I69959 Hemiplegia and hemiparesis following unspecified cerebrovascular disease affecting unspecified side: Secondary | ICD-10-CM | POA: Diagnosis not present

## 2014-09-30 DIAGNOSIS — Z1389 Encounter for screening for other disorder: Secondary | ICD-10-CM | POA: Diagnosis not present

## 2014-09-30 DIAGNOSIS — E785 Hyperlipidemia, unspecified: Secondary | ICD-10-CM | POA: Diagnosis not present

## 2014-09-30 DIAGNOSIS — G629 Polyneuropathy, unspecified: Secondary | ICD-10-CM | POA: Diagnosis not present

## 2014-11-10 ENCOUNTER — Ambulatory Visit: Payer: Medicare Other | Admitting: Physical Medicine & Rehabilitation

## 2014-11-30 ENCOUNTER — Encounter: Payer: Medicare Other | Attending: Physical Medicine & Rehabilitation | Admitting: Physical Medicine & Rehabilitation

## 2014-11-30 ENCOUNTER — Encounter: Payer: Self-pay | Admitting: Physical Medicine & Rehabilitation

## 2014-11-30 VITALS — BP 125/81 | HR 97

## 2014-11-30 DIAGNOSIS — E7800 Pure hypercholesterolemia, unspecified: Secondary | ICD-10-CM | POA: Diagnosis not present

## 2014-11-30 DIAGNOSIS — G8110 Spastic hemiplegia affecting unspecified side: Secondary | ICD-10-CM

## 2014-11-30 DIAGNOSIS — M79631 Pain in right forearm: Secondary | ICD-10-CM | POA: Insufficient documentation

## 2014-11-30 DIAGNOSIS — E119 Type 2 diabetes mellitus without complications: Secondary | ICD-10-CM | POA: Diagnosis not present

## 2014-11-30 DIAGNOSIS — I6330 Cerebral infarction due to thrombosis of unspecified cerebral artery: Secondary | ICD-10-CM | POA: Diagnosis not present

## 2014-11-30 DIAGNOSIS — I10 Essential (primary) hypertension: Secondary | ICD-10-CM | POA: Diagnosis not present

## 2014-11-30 DIAGNOSIS — I69320 Aphasia following cerebral infarction: Secondary | ICD-10-CM | POA: Diagnosis not present

## 2014-11-30 DIAGNOSIS — I69351 Hemiplegia and hemiparesis following cerebral infarction affecting right dominant side: Secondary | ICD-10-CM | POA: Insufficient documentation

## 2014-11-30 DIAGNOSIS — F329 Major depressive disorder, single episode, unspecified: Secondary | ICD-10-CM | POA: Diagnosis not present

## 2014-11-30 DIAGNOSIS — M25521 Pain in right elbow: Secondary | ICD-10-CM | POA: Diagnosis not present

## 2014-11-30 NOTE — Progress Notes (Signed)
Subjective:    Patient ID: Craig Parrish, male    DOB: [birth year], 59 y.o.   MRN: [id number]  HPI   Craig Parrish is here in follow up of his CVA and spastic right hemiparesis. His right elbow pain has resolved. We were concerned about a fracture at last visit but xrays were negative. His elbow feels good but has been getting tighter since then.  He is wearing his splint more at night. He uses a towel role during the day.   Emotionally he has been doing better. He's been more active at home. He goes on walks and uses the stationary bike, gym, etc.    Pain Inventory Average Pain 0 Pain Right Now 0 My pain is n/a  In the last 24 hours, has pain interfered with the following? General activity 0 Relation with others 0 Enjoyment of life 0 What TIME of day is your pain at its worst? n/a Sleep (in general) Good  Pain is worse with: n/a Pain improves with: n/a Relief from Meds: n/a  Mobility walk without assistance how many minutes can you walk? 60-90 ability to climb steps?  yes do you drive?  no Do you have any goals in this area?  no  Function disabled: date disabled 02/2010 I need assistance with the following:  meal prep and household duties  Neuro/Psych No problems in this area  Prior Studies Any changes since last visit?  no  Physicians involved in your care Any changes since last visit?  no   Family History  Problem Relation Age of Onset  . Heart disease Mother   . Diabetes Mother   . Heart disease Father    Social History   Social History  . Marital Status: Married    Spouse Name: N/A  . Number of Children: N/A  . Years of Education: N/A   Social History Main Topics  . Smoking status: Never Smoker   . Smokeless tobacco: Never Used  . Alcohol Use: Yes     Comment: 2 x week  . Drug Use: No  . Sexual Activity: Not Asked   Other Topics Concern  . None   Social History Narrative   Past Surgical History  Procedure Laterality Date  . Appendectomy      Past Medical History  Diagnosis Date  . Diabetes mellitus   . Stroke ([place])   . Cancer ([place])   . Hypertension   . High cholesterol   . Seizures ([facility])    BP 125/81 mmHg  Pulse 97  SpO2 100%  Opioid Risk Score:   Fall Risk Score:  `1  Depression screen PHQ 2/9  No flowsheet data found.    Review of Systems  Musculoskeletal: Positive for gait problem.  All other systems reviewed and are negative.      Objective:   Physical Exam General: Alert and oriented x 3, No apparent distress. Has lost weight HEENT: Head is normocephalic, atraumatic, PERRLA, EOMI, sclera anicteric, oral mucosa pink and moist, dentition intact, ext ear canals clear,  Neck: Supple without JVD or lymphadenopathy  Heart: Reg rate and rhythm. No murmurs rubs or gallops  Chest: CTA bilaterally without wheezes, rales, or rhonchi; no distress  Abdomen: Soft, non-tender, non-distended, bowel sounds positive.  Extremities: No clubbing, cyanosis, or edema. Pulses are 2+  Skin:  Intact  Neuro: He is alert and oriented. RUE is 1/5. HF are 2. HAD are 3. KE 3-, KF 2+, ADF trace, APF is 1-2. DTR's are 3+ on the  right. Tone is 2 at FDS, FDP. 1 PT, BR and Biceps 1. PECS 1. Lats 1, TM 2.. He has a mild right central 7 and tongue deviation. Sensation is 1 to 1+ on the right. 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 He walks with some weight shift to the right and hyperextension of the right knee still in stance. Knee flexion during swing is better but he circumducts somewhat still and moves the leg en mas.   Musculoskeletal: no tenderness at right elbow--he has full extension and flexion as well as rotation today.  Posture appropriate. b  Psych: Pt's affect is appropriate. Pt is cooperative. In good spirits    Assessment & Plan:   Assessment:  1. 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 Reactive depression- improved with zoloft  3. NIRDM-  controlled  4. Fall with acute right elbow, proximal forearm pain. ?proximal radial fracture vs contusion    Plan:  1. Aggressive ROM and exercise at home. Probably cold benefit from botox at some point, but he and his wife stay aggressive enough at home, that we can hold off at this time. May need a new WHO at some point---wife has an rx from me already.  2. Continue with baclofen at 10mg  TID  3. Continue zoloft for mood.  4. Keppra 500mg  Q12 per Dr. Brigitte Pulse.  5. Etoh abstinence  6. Follow up with me in about 6 months.  15 minutes of face to face patient care time were spent during this visit. All questions were encouraged and answered.

## 2014-11-30 NOTE — Patient Instructions (Signed)
KEEP UP WITH REGULAR STRETCHING AND EXERCISE AT HOME.   PLEASE CALL ME WITH ANY PROBLEMS OR QUESTIONS [id number]). HAVE A HAPPY HOLIDAY SEASON!!!

## 2015-02-08 ENCOUNTER — Telehealth: Payer: Self-pay | Admitting: Physical Medicine & Rehabilitation

## 2015-02-08 NOTE — Telephone Encounter (Signed)
Spoke with patient wrist orthotic has broke, Dr. Naaman Plummer ordered a wrist brace back in 09/29/13 and patient did not want to have filled at that time because they already had one. Sending previous Rx to [facility].

## 2015-06-01 ENCOUNTER — Encounter: Payer: Self-pay | Admitting: Physical Medicine & Rehabilitation

## 2015-06-01 ENCOUNTER — Encounter: Payer: Medicare Other | Attending: Physical Medicine & Rehabilitation | Admitting: Physical Medicine & Rehabilitation

## 2015-06-01 VITALS — BP 109/63 | HR 80

## 2015-06-01 DIAGNOSIS — R569 Unspecified convulsions: Secondary | ICD-10-CM | POA: Insufficient documentation

## 2015-06-01 DIAGNOSIS — E7800 Pure hypercholesterolemia, unspecified: Secondary | ICD-10-CM | POA: Diagnosis not present

## 2015-06-01 DIAGNOSIS — F1010 Alcohol abuse, uncomplicated: Secondary | ICD-10-CM | POA: Diagnosis not present

## 2015-06-01 DIAGNOSIS — G8111 Spastic hemiplegia affecting right dominant side: Secondary | ICD-10-CM | POA: Diagnosis not present

## 2015-06-01 DIAGNOSIS — R4701 Aphasia: Secondary | ICD-10-CM | POA: Diagnosis not present

## 2015-06-01 DIAGNOSIS — Z859 Personal history of malignant neoplasm, unspecified: Secondary | ICD-10-CM | POA: Insufficient documentation

## 2015-06-01 DIAGNOSIS — Z79899 Other long term (current) drug therapy: Secondary | ICD-10-CM | POA: Diagnosis not present

## 2015-06-01 DIAGNOSIS — Z8673 Personal history of transient ischemic attack (TIA), and cerebral infarction without residual deficits: Secondary | ICD-10-CM | POA: Insufficient documentation

## 2015-06-01 DIAGNOSIS — F329 Major depressive disorder, single episode, unspecified: Secondary | ICD-10-CM | POA: Diagnosis not present

## 2015-06-01 DIAGNOSIS — G8110 Spastic hemiplegia affecting unspecified side: Secondary | ICD-10-CM

## 2015-06-01 DIAGNOSIS — E119 Type 2 diabetes mellitus without complications: Secondary | ICD-10-CM | POA: Insufficient documentation

## 2015-06-01 DIAGNOSIS — I10 Essential (primary) hypertension: Secondary | ICD-10-CM | POA: Insufficient documentation

## 2015-06-01 NOTE — Progress Notes (Signed)
Subjective:    Patient ID: Craig Parrish, male    DOB: 1955/02/08, 60 y.o.   MRN: [id number]  HPI   Charan is here in follow up of his CVA and spastic right hemiparesis. He has liked his new wrist splint which has helped with positioning and tone. He has been active at the gym with lifting and aerobic exercise.   Health-wise he's been stable. His sugars are under better control.   Pain Inventory Average Pain 0 Pain Right Now 0 My pain is No Pain  In the last 24 hours, has pain interfered with the following? General activity 0 Relation with others 0 Enjoyment of life 0 What TIME of day is your pain at its worst? No Pain Sleep (in general) NA  Pain is worse with: No Pain Pain improves with: No Pain Relief from Meds: 0  Mobility walk without assistance how many minutes can you walk? 1 hr ability to climb steps?  yes do you drive?  no Do you have any goals in this area?  yes  Function retired Do you have any goals in this area?  yes  Neuro/Psych No problems in this area  Prior Studies Any changes since last visit?  no  Physicians involved in your care Any changes since last visit?  no   Family History  Problem Relation Age of Onset  . Heart disease Mother   . Diabetes Mother   . Heart disease Father    Social History   Social History  . Marital Status: Married    Spouse Name: N/A  . Number of Children: N/A  . Years of Education: N/A   Social History Main Topics  . Smoking status: Never Smoker   . Smokeless tobacco: Never Used  . Alcohol Use: Yes     Comment: 2 x week  . Drug Use: No  . Sexual Activity: Not Asked   Other Topics Concern  . None   Social History Narrative   Past Surgical History  Procedure Laterality Date  . Appendectomy     Past Medical History  Diagnosis Date  . Diabetes mellitus   . Stroke ([place])   . Cancer ([place])   . Hypertension   . High cholesterol   . Seizures ([facility])    BP 109/63 mmHg  Pulse 80  SpO2  90%  Opioid Risk Score:   Fall Risk Score:  `1  Depression screen PHQ 2/9  No flowsheet data found.   Review of Systems     Objective:   Physical Exam  General: Alert and oriented x 3, No apparent distress. Has lost weight  HEENT: Head is normocephalic, atraumatic, PERRLA, EOMI, sclera anicteric, oral mucosa pink and moist, dentition intact, ext ear canals clear,  Neck: Supple without JVD or lymphadenopathy  Heart: Reg rate and rhythm. No murmurs rubs or gallops  Chest: CTA bilaterally without wheezes, rales, or rhonchi; no distress  Abdomen: Soft, non-tender, non-distended, bowel sounds positive.  Extremities: No clubbing, cyanosis, or edema. Pulses are 2+  Skin: Intact  Neuro: He is alert and oriented. RUE is 1/5. HF are 2. HAD are 3. KE 3-, KF 2+, ADF trace, APF is 1-2. DTR's are 3+ on the right. MAS  2 at FDS, FDP. 1 PT, BR and Biceps 1. PECS 1. Lats 1, TM 2.. He has a mild right central 7 and tongue deviation. Sensation is 1 to 1+ on the right. 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 His gait is improving.  He uses less circumduction but there is still little knee bend in swing phase and the knee hyperextends in stance. heel strike is better. Musculoskeletal: no tenderness at right elbow--he has full extension and flexion as well as rotation today. Posture appropriate. b  Psych: Pt's affect is appropriate. Pt is cooperative. In good spirits    Assessment & Plan:   Assessment:  1. 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 Reactive depression- improved with zoloft  3. NIRDM- controlled  4. Fall with acute right elbow, proximal forearm pain. ?proximal radial fracture vs contusion    Plan:  1. Aggressive ROM and exercise at home. Botox is still an option.  -discussed need to continue focusing on gait quality/technique 2. Continue with baclofen at 10mg  TID  3. Continue zoloft for mood.  4. Keppra 500mg   Q12 per Dr. Brigitte Pulse.  5. Etoh abstinence  6. Follow up with me PRN. 15 minutes of face to face patient care time were spent during this visit. All questions were encouraged and answered.

## 2015-06-01 NOTE — Patient Instructions (Signed)
CONTINUE TO FOCUS ON YOUR RANGE OF MOTION AND GAIT QUALITY    PLEASE CALL ME WITH ANY PROBLEMS OR QUESTIONS [id number]).

## 2015-11-24 ENCOUNTER — Telehealth: Payer: Self-pay | Admitting: *Deleted

## 2015-11-24 NOTE — Telephone Encounter (Signed)
Spoke to his wife and instructed her to contact his PCP, Dr. Brigitte Pulse, for refills (based on last office note instructions in 04/2013).  They will contact him for refills.

## 2016-11-13 ENCOUNTER — Encounter: Payer: Self-pay | Admitting: Internal Medicine

## 2016-12-10 ENCOUNTER — Ambulatory Visit: Payer: Medicare Other | Admitting: Podiatry

## 2016-12-10 ENCOUNTER — Ambulatory Visit: Payer: Medicare Other

## 2016-12-10 DIAGNOSIS — S99921A Unspecified injury of right foot, initial encounter: Secondary | ICD-10-CM

## 2016-12-10 DIAGNOSIS — M79671 Pain in right foot: Secondary | ICD-10-CM

## 2016-12-12 NOTE — Progress Notes (Signed)
   HPI: 61 year old male with past medical history of type 2 diabetes presenting today with a chief complaint of pain to the right great toe that began 4 days ago.  He reports associated redness around the nail bed.  He states he can feel the right side of his body due to a previous stroke.  Patient is here for further evaluation and treatment.  Past Medical History:  Diagnosis Date  . Cancer ([place])   . Diabetes mellitus   . High cholesterol   . Hypertension   . Seizures ([place])   . Stroke [facility])      Physical Exam: General: The patient is alert and oriented x3 in no acute distress.  Dermatology: Skin is warm, dry and supple bilateral lower extremities. Negative for open lesions or macerations.  Vascular: Minimal edema with erythema surrounding the nail plate of the right great toe. Palpable pedal pulses bilaterally. Capillary refill within normal limits.  Neurological: Epicritic and protective threshold grossly intact bilaterally.   Musculoskeletal Exam: Range of motion within normal limits to all pedal and ankle joints bilateral. Muscle strength 5/5 in all groups bilateral.   Assessment: -Right great toe injury -History of stroke right side   Plan of Care:  -Patient evaluated. -Recommended good, protective shoes. -Advised patient to observe for signs of infection. -Return to clinic as needed.   Edrick Kins, DPM [facility]  Dr. Edrick Kins, DPM    [address], [place] [address]                Office [phone]  Fax [phone]

## 2017-08-01 IMAGING — CR DG ELBOW 2V*R*
2 series · 2 of 2 positions shown · non-contrast
Comparison: None in PACs

CLINICAL DATA: Status post fall 3 days ago striking the right
elbow, soft tissue swelling, patient under able to cooperate with or
tolerate true AP or lateral positioning.

EXAM:
RIGHT ELBOW - 2 VIEW

[x elbow obl right]
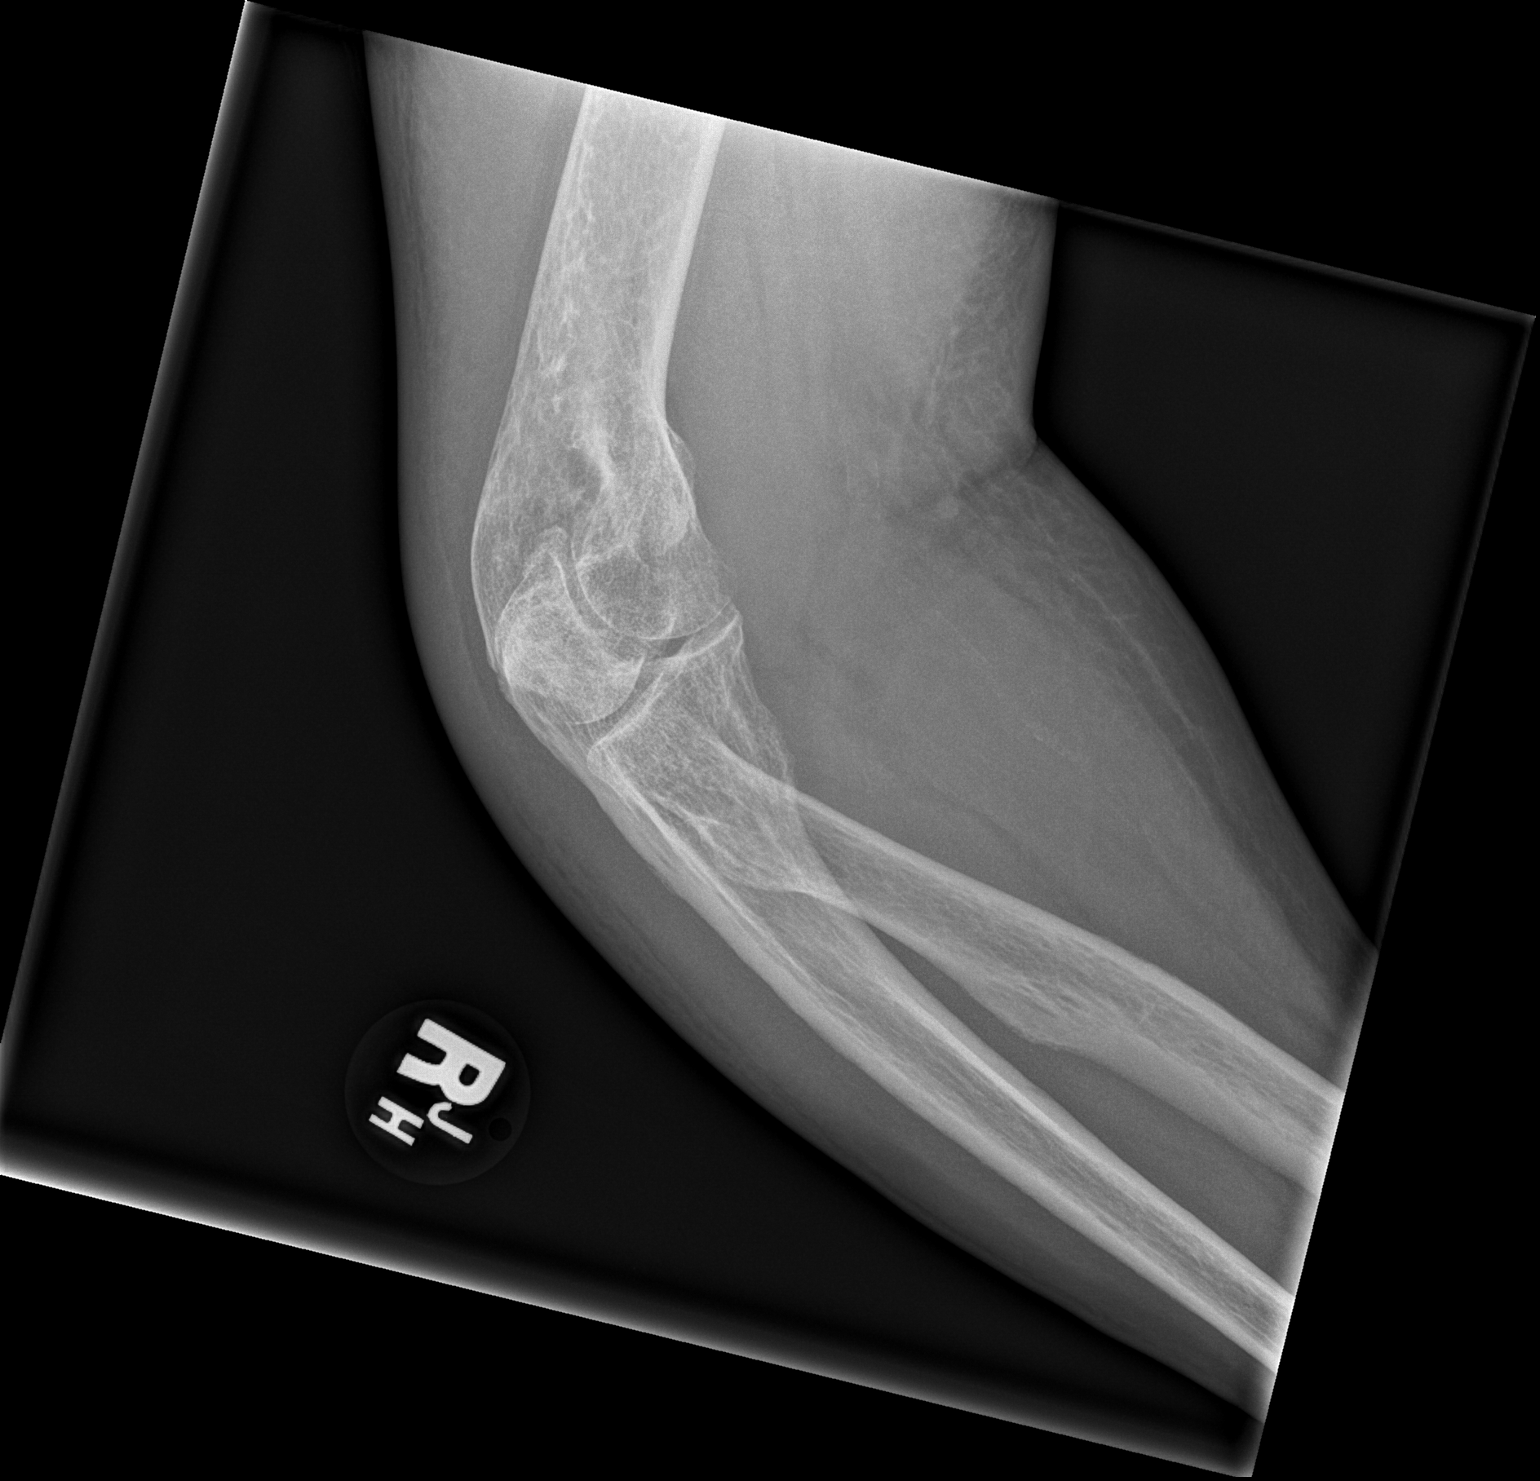

[x elbow lat right]
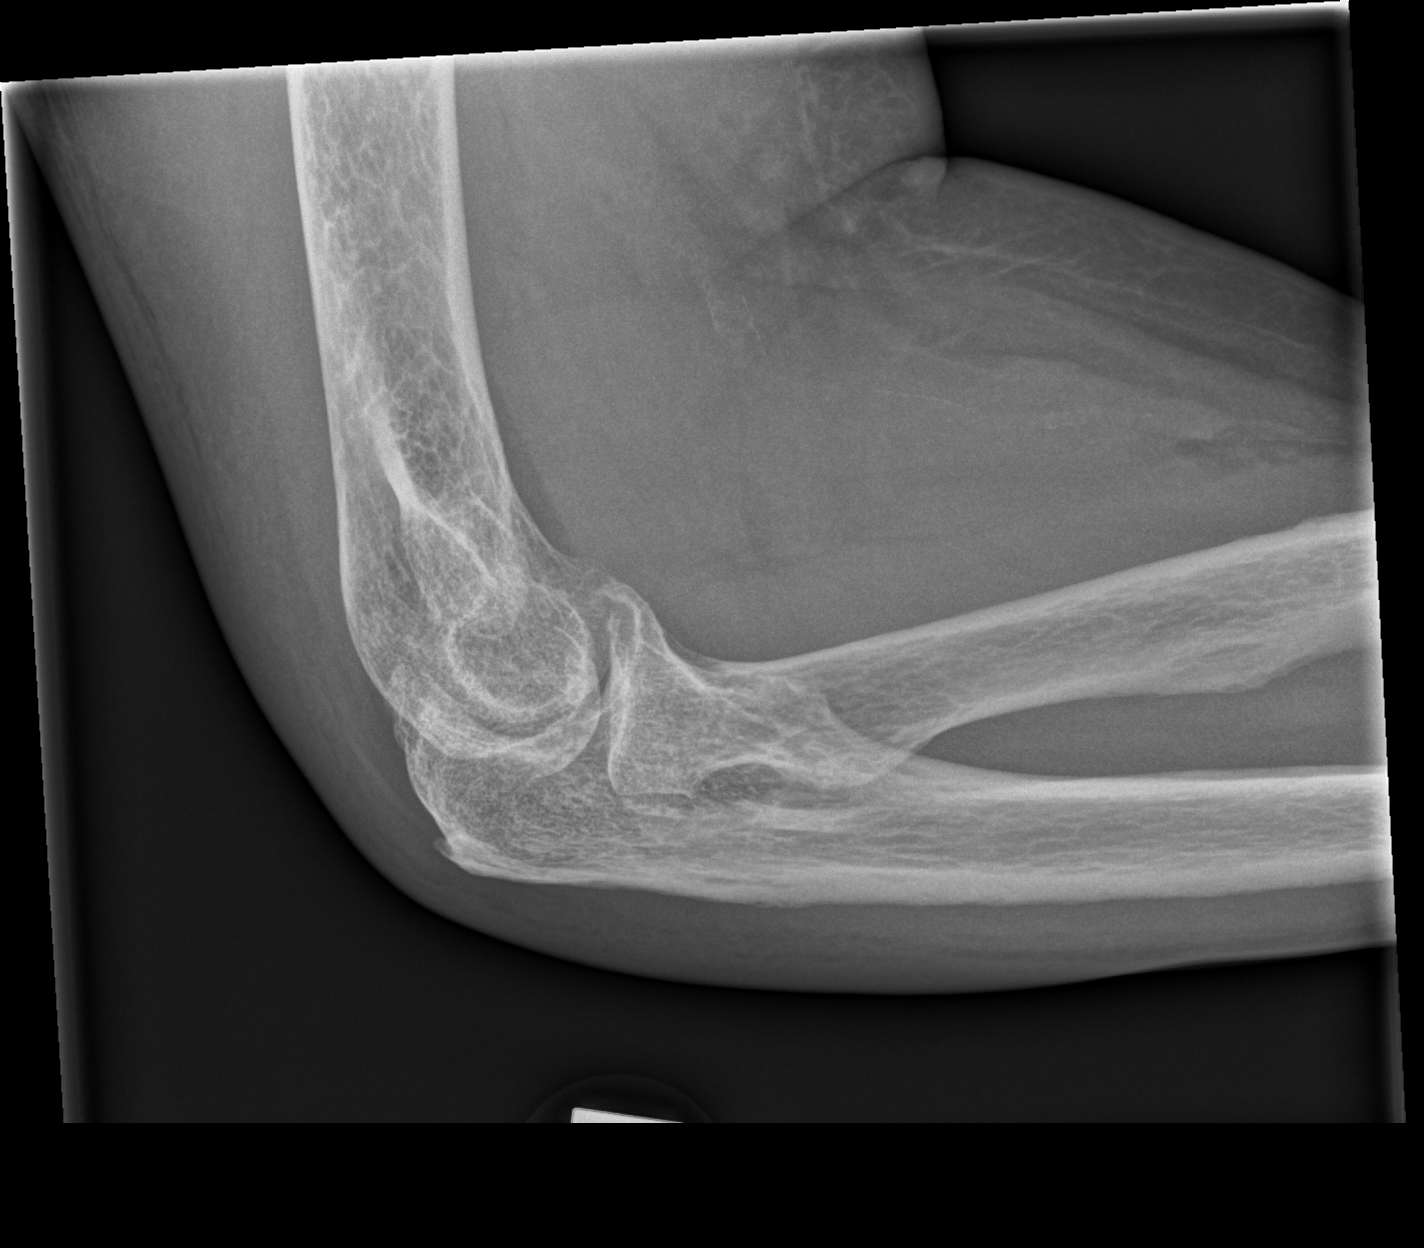

[2 of 2 positions shown; findings below may reference images not displayed]

FINDINGS: The bones are mildly osteopenic. As best as can be determined the
radial head is intact. No olecranon fracture is observed. There is a
small olecranon spur. The distal humerus is grossly intact. There is
no definite joint effusion. There is soft tissue swelling over the
extensor surface of the elbow.
IMPRESSION: The study is quite limited. There is soft tissue swelling over the
extensor surface of the elbow. There is no definite acute fracture
but true AP and true lateral images could not be obtained. If there
are strong clinical concerns of occult fracture, CT scanning would
be the most useful next imaging step.

## 2020-07-06 LAB — COLOGUARD: COLOGUARD: NEGATIVE

## 2023-03-31 LAB — COLOGUARD: COLOGUARD: NEGATIVE

## 2023-09-16 ENCOUNTER — Ambulatory Visit
Admission: EM | Admit: 2023-09-16 | Discharge: 2023-09-16 | Disposition: A | Discharge status: Home / Self Care | Attending: Family Medicine | Admitting: Family Medicine

## 2023-09-16 DIAGNOSIS — S91001A Unspecified open wound, right ankle, initial encounter: Secondary | ICD-10-CM | POA: Diagnosis not present

## 2023-09-16 MED ORDER — DOXYCYCLINE HYCLATE 100 MG PO CAPS: 100.0000 mg | ORAL_CAPSULE | Freq: Two times a day (BID) | ORAL | 0 refills | Status: AC

## 2023-09-16 MED ORDER — BACITRACIN ZINC 500 UNIT/GM EX OINT: 1.0000 | TOPICAL_OINTMENT | Freq: Two times a day (BID) | CUTANEOUS | 0 refills | Status: AC

## 2023-09-16 NOTE — Discharge Instructions (Signed)
 Start doxycycline  twice daily for 7 days for an infection of your wound. Change your dressing 2-3 times daily. Every time you change your dressing, clean the wound gently with warm water and Dial antibacterial soap. Pat the wound dry, let it breathe for roughly an hour before covering it back up. When you reapply a dressing, apply Bacitracin  ointment to the wound, then cover with non-stick/non-adherent gauze.

## 2023-09-16 NOTE — ED Provider Notes (Signed)
 [facility]  Note:  This document was prepared using Conservation officer, historic buildings and may include unintentional dictation errors.  MRN: [id number] DOB: May 08, 1955  Subjective:   Craig Parrish is a 68 y.o. male presenting for 3 day history of a sore over the right ankle.  Has minimal tenderness.  Has been using triple antibiotic and peroxide to the area.  Patient has been wearing a right ankle brace/splint since June 2025 for foot drop.  This was following a stroke.  Has not had issues until this past weekend.  No fever, drainage of pus or bleeding.  No current facility-administered medications for this encounter.  Current Outpatient Medications:    ACCU-CHEK AVIVA PLUS test strip, , Disp: , Rfl:    atorvastatin (LIPITOR) 20 MG tablet, Take 20 mg by mouth at bedtime. , Disp: , Rfl:    baclofen  (LIORESAL ) 10 MG tablet, Take 10 mg by mouth 3 (three) times daily., Disp: , Rfl:    clopidogrel (PLAVIX) 75 MG tablet, Take 75 mg by mouth daily. , Disp: , Rfl:    levETIRAcetam  (KEPPRA ) 500 MG tablet, TAKE 1 TABLET TWICE A DAY, Disp: 180 tablet, Rfl: 0   metFORMIN (GLUCOPHAGE) 500 MG tablet, Take 1,500 mg by mouth 2 (two) times daily with a meal., Disp: , Rfl:    ramipril (ALTACE) 5 MG capsule, Take 5 mg by mouth daily., Disp: , Rfl:    sertraline  (ZOLOFT ) 25 MG tablet, TAKE 1 TABLET BY MOUTH DAILY, Disp: 30 tablet, Rfl: 3   Allergies  Allergen Reactions   Aspirin    Other     Mushrooms   Penicillins     Past Medical History:  Diagnosis Date   Cancer (HCC)    Diabetes mellitus    High cholesterol    Hypertension    Seizures (HCC)    Stroke [facility])      Past Surgical History:  Procedure Laterality Date   APPENDECTOMY      Family History  Problem Relation Age of Onset   Heart disease Mother    Diabetes Mother    Heart disease Father     Social History   Tobacco Use   Smoking status: Never   Smokeless tobacco: Never  Substance Use Topics    Alcohol use: Yes    Comment: 2 x week   Drug use: No    ROS   Objective:   Vitals: BP 121/69 (BP Location: Right Arm)   Pulse 80   Temp 98.5 F (36.9 C) (Oral)   Resp 18   SpO2 99%   Physical Exam Constitutional:      General: He is not in acute distress.    Appearance: Normal appearance. He is well-developed and normal weight. He is not ill-appearing, toxic-appearing or diaphoretic.  HENT:     Head: Normocephalic and atraumatic.     Right Ear: External ear normal.     Left Ear: External ear normal.     Nose: Nose normal.     Mouth/Throat:     Pharynx: Oropharynx is clear.  Eyes:     General: No scleral icterus.       Right eye: No discharge.        Left eye: No discharge.     Extraocular Movements: Extraocular movements intact.  Cardiovascular:     Rate and Rhythm: Normal rate.  Pulmonary:     Effort: Pulmonary effort is normal.  Musculoskeletal:     Cervical back: Normal range  of motion.     Right ankle: No swelling, deformity, ecchymosis or lacerations. Tenderness (minimal over ~1cm resolved abrasion type lesion directly over the lateral malleolus) present over the lateral malleolus. No medial malleolus, ATF ligament, AITF ligament, CF ligament, posterior TF ligament, base of 5th metatarsal or proximal fibula tenderness. Normal range of motion.     Right Achilles Tendon: No tenderness or defects. Thompson's test negative.  Neurological:     Mental Status: He is alert and oriented to person, place, and time.  Psychiatric:        Mood and Affect: Mood normal.        Behavior: Behavior normal.        Thought Content: Thought content normal.        Judgment: Judgment normal.    Applied a dressing using bacitracin , nonadherent and Coban.  Assessment and Plan :   PDMP not reviewed this encounter.  1. Ankle wound, right, initial encounter    Patient and I both agreed to defer imaging given lack of concern for osteomyelitis.  Recommend doxycycline , wound care.   Counseled patient on potential for adverse effects with medications prescribed/recommended today, ER and return-to-clinic precautions discussed, patient verbalized understanding.    Christopher Savannah, PA-C 09/16/23 1200

## 2023-09-16 NOTE — ED Triage Notes (Signed)
 Pt presents with a sore in the right ankle x 2-3 days. Pt concern as he is diabetic. Pt wife cleaned with peroxide and used triple antibiotic. Pt is using a brace in the right leg since June 2025 for drop foot, he had after a stroke.
